# Patient Record
Sex: Female | Born: 1975 | Race: White | Hispanic: Yes | Marital: Married | Smoking: Never smoker
Health system: Southern US, Community
[De-identification: ages and names within clinical notes are randomized; demographics above are authoritative.]

## PROBLEM LIST (undated history)

## (undated) DIAGNOSIS — E7800 Pure hypercholesterolemia, unspecified: Secondary | ICD-10-CM

## (undated) DIAGNOSIS — N189 Chronic kidney disease, unspecified: Secondary | ICD-10-CM

## (undated) DIAGNOSIS — Z91018 Allergy to other foods: Secondary | ICD-10-CM

## (undated) DIAGNOSIS — G4730 Sleep apnea, unspecified: Secondary | ICD-10-CM

## (undated) DIAGNOSIS — M7989 Other specified soft tissue disorders: Secondary | ICD-10-CM

## (undated) DIAGNOSIS — I10 Essential (primary) hypertension: Secondary | ICD-10-CM

## (undated) DIAGNOSIS — K760 Fatty (change of) liver, not elsewhere classified: Secondary | ICD-10-CM

## (undated) DIAGNOSIS — E559 Vitamin D deficiency, unspecified: Secondary | ICD-10-CM

## (undated) DIAGNOSIS — C801 Malignant (primary) neoplasm, unspecified: Secondary | ICD-10-CM

## (undated) DIAGNOSIS — E119 Type 2 diabetes mellitus without complications: Secondary | ICD-10-CM

## (undated) DIAGNOSIS — R7303 Prediabetes: Secondary | ICD-10-CM

## (undated) DIAGNOSIS — K5900 Constipation, unspecified: Secondary | ICD-10-CM

## (undated) HISTORY — DX: Allergy to other foods: Z91.018

## (undated) HISTORY — DX: Pure hypercholesterolemia, unspecified: E78.00

## (undated) HISTORY — DX: Vitamin D deficiency, unspecified: E55.9

## (undated) HISTORY — DX: Fatty (change of) liver, not elsewhere classified: K76.0

## (undated) HISTORY — DX: Prediabetes: R73.03

## (undated) HISTORY — DX: Malignant (primary) neoplasm, unspecified: C80.1

## (undated) HISTORY — DX: Other specified soft tissue disorders: M79.89

## (undated) HISTORY — DX: Constipation, unspecified: K59.00

---

## 2013-12-29 ENCOUNTER — Other Ambulatory Visit: Payer: Self-pay | Admitting: Urology

## 2013-12-29 ENCOUNTER — Encounter (HOSPITAL_COMMUNITY): Payer: Self-pay | Admitting: Pharmacy Technician

## 2013-12-31 ENCOUNTER — Encounter (HOSPITAL_COMMUNITY): Payer: Self-pay | Admitting: *Deleted

## 2014-01-01 ENCOUNTER — Ambulatory Visit (HOSPITAL_COMMUNITY): Payer: Managed Care, Other (non HMO)

## 2014-01-01 ENCOUNTER — Ambulatory Visit (HOSPITAL_COMMUNITY)
Admission: RE | Admit: 2014-01-01 | Discharge: 2014-01-01 | Disposition: A | Discharge status: Home / Self Care | Payer: Managed Care, Other (non HMO) | Source: Ambulatory Visit | Attending: Urology | Admitting: Urology

## 2014-01-01 ENCOUNTER — Encounter (HOSPITAL_COMMUNITY): Payer: Self-pay | Admitting: *Deleted

## 2014-01-01 ENCOUNTER — Encounter (HOSPITAL_COMMUNITY)
Admission: RE | Disposition: A | Discharge status: Home / Self Care | Payer: Self-pay | Source: Ambulatory Visit | Attending: Urology

## 2014-01-01 DIAGNOSIS — I10 Essential (primary) hypertension: Secondary | ICD-10-CM | POA: Insufficient documentation

## 2014-01-01 DIAGNOSIS — G4730 Sleep apnea, unspecified: Secondary | ICD-10-CM | POA: Insufficient documentation

## 2014-01-01 DIAGNOSIS — N201 Calculus of ureter: Secondary | ICD-10-CM | POA: Insufficient documentation

## 2014-01-01 DIAGNOSIS — E7800 Pure hypercholesterolemia, unspecified: Secondary | ICD-10-CM | POA: Insufficient documentation

## 2014-01-01 DIAGNOSIS — N200 Calculus of kidney: Secondary | ICD-10-CM

## 2014-01-01 DIAGNOSIS — Z79899 Other long term (current) drug therapy: Secondary | ICD-10-CM | POA: Insufficient documentation

## 2014-01-01 DIAGNOSIS — N1330 Unspecified hydronephrosis: Secondary | ICD-10-CM | POA: Insufficient documentation

## 2014-01-01 HISTORY — DX: Chronic kidney disease, unspecified: N18.9

## 2014-01-01 HISTORY — DX: Essential (primary) hypertension: I10

## 2014-01-01 HISTORY — DX: Sleep apnea, unspecified: G47.30

## 2014-01-01 LAB — PREGNANCY, URINE: Preg Test, Ur: NEGATIVE

## 2014-01-01 SURGERY — LITHOTRIPSY, ESWL
Anesthesia: LOCAL | Laterality: Right

## 2014-01-01 MED ORDER — DIPHENHYDRAMINE HCL 25 MG PO CAPS
25.0000 mg | ORAL_CAPSULE | ORAL | Status: AC
  Administered 2014-01-01: 25 mg via ORAL
  Filled 2014-01-01: qty 1

## 2014-01-01 MED ORDER — TAMSULOSIN HCL 0.4 MG PO CAPS: 0.4000 mg | ORAL_CAPSULE | Freq: Every day | ORAL | Status: DC

## 2014-01-01 MED ORDER — ONDANSETRON HCL 4 MG PO TABS: 4.0000 mg | ORAL_TABLET | Freq: Three times a day (TID) | ORAL | Status: DC | PRN

## 2014-01-01 MED ORDER — SODIUM CHLORIDE 0.9 % IV SOLN
INTRAVENOUS | Status: DC
  Administered 2014-01-01: 07:00:00 via INTRAVENOUS

## 2014-01-01 MED ORDER — CIPROFLOXACIN HCL 500 MG PO TABS
500.0000 mg | ORAL_TABLET | ORAL | Status: AC
  Administered 2014-01-01: 500 mg via ORAL
  Filled 2014-01-01: qty 1

## 2014-01-01 MED ORDER — DIAZEPAM 5 MG PO TABS
10.0000 mg | ORAL_TABLET | ORAL | Status: AC
  Administered 2014-01-01: 10 mg via ORAL
  Filled 2014-01-01: qty 2

## 2014-01-01 NOTE — H&P (Signed)
Reason For Visit Bilateral nephrolithiasis   History of Present Illness This is a 38 year old female who is referred by Dr. Glendon Axe, M.D. for evaluation and management of nephrolithiasis.    As part of workup for abdominal pain. Patient had a renal ultrasound on 08/30/48. This showed bilateral nephrolithiasis. She then underwent a CT scan on 09/13/13, results are not available to me. Her report the patient has a large stone on the right. She also related that her ultrasound showed "fluid in her kidneys". The patient has a history of kidney stones. [place] she had an IVP approximately 20 years ago for an obstructing stone which she subsequently passed. She states she has passed many stones since then, the last stone was about 6 years ago. The patient has occasional twinges on her right side/flank. She denies any fevers or chills. She denies any gross hematuria. She does state that she has urinary frequency but denies any incontinence. She has no history of recurrent urinary tract infections.   Past Medical History Problems  1. History of hypercholesterolemia (V12.29) 2. History of hypertension (V12.59) 3. History of sleep apnea (V13.89)  Surgical History Problems  1. History of Cesarean Section  Current Meds 1. Advil TABS;  Therapy: (Recorded:07May2015) to Recorded 2. Phentermine HCl - 37.5 MG Oral Capsule;  Therapy: (Recorded:07May2015) to Recorded 3. Vitamin D TABS;  Therapy: (Recorded:07May2015) to Recorded  Allergies Medication  1. No Known Drug Allergies  Family History Problems  1. Family history of cardiac disorder (V17.49) : Mother, Father 2. Family history of diabetes mellitus (V18.0) : Mother, Father 3. Family history of hyperlipidemia (V18.19) : Father 4. Family history of hypertension (V17.49) : Father  Social History Problems    Denied: History of Alcohol use   Caffeine use (V49.89)   Married   Never a smoker   Number of  children   1 daughter  Review of Systems Genitourinary, constitutional, skin, eye, otolaryngeal, hematologic/lymphatic, cardiovascular, pulmonary, endocrine, musculoskeletal, gastrointestinal, neurological and psychiatric system(s) were reviewed and pertinent findings if present are noted.  Genitourinary: initiating urination requires straining.  Gastrointestinal: heartburn and constipation.  Constitutional: night sweats and recent weight loss.    Vitals Vital Signs [Data Includes: Last 1 Day]  Recorded: [id number] 08:57AM  Blood Pressure: 148 / 88 Temperature: 98.1 F Heart Rate: 85 Recorded: [id number] 08:49AM  Height: 5 ft 2 in Weight: 196 lb  BMI Calculated: 35.85 BSA Calculated: 1.9  Results/Data Urine [Data Includes: Last 1 Day]   [id number]  COLOR YELLOW   APPEARANCE CLEAR   SPECIFIC GRAVITY 1.025   pH 5.5   GLUCOSE NEG mg/dL  BILIRUBIN NEG   KETONE NEG mg/dL  BLOOD TRACE   PROTEIN NEG mg/dL  UROBILINOGEN 0.2 mg/dL  NITRITE NEG   LEUKOCYTE ESTERASE SMALL   SQUAMOUS EPITHELIAL/HPF FEW   WBC 7-10 WBC/hpf  RBC 0-2 RBC/hpf  BACTERIA RARE   CRYSTALS NONE SEEN   CASTS NONE SEEN   Other MUCUS   Exam description: renal ultrasound. Kidneys: left kidney 13.08 cm in longitudinal dimension. Stone #1 characteristics: left middle calyx, 0.53 cm mm. Hydronephrosis: right moderate hydronephrosis. Ureteral dilatation: right moderate ureteral dilation. Hydroureteronephrosis findings: no left hydroureteronephrosis.  Exam description: kidney, ureter and bladder x-rays of the abdomen. Bowels: normal bowel gas pattern. Calcification findings: left renal area. 5 mm Solid organs: solid organs visualized are normal in size, appearance and position.    Assessment Assessed  1. Nephrolithiasis (592.0)  Based on the information that I have the  patient has a left 5 mm interpolar nonobstructing stone and right-sided mild to moderate hydro-nephrosis and hydro-ureter. There is no evidence of any  stones within the right urinary tract based on the imaging today.   Plan Health Maintenance  1. UA With REFLEX; [Do Not Release]; Status:Complete;   Done: [id number] 08:38AM Nephrolithiasis  2. Follow-up Office  Follow-up, we'll contact patient once I get the results of her CAT scan to  discuss follow-up and treatment options for her right hydronephrosis.  Status: Hold For -  Appointment,Date of Service  Requested for: [id number] 3. KUB; Status:Resulted - Requires Verification;   Done: [id number] 12:00AM 4. RENAL U/S COMPLETE; Status:Resulted - Requires Verification;   Done: [id number]  12:00AM 5. URINE CULTURE; Status:In Progress - Specimen/Data Collected;   Done: [id number]  Discussion/Summary I discussed the findings with the patient in detail. We will obtain her CT images that were taken at [facility] 3 months prior. Once I had a chance to evaluate these I will call the patient with the findings and recommend a plan of action. We did briefly discuss cystoscopy and retrograde pyelogram on the right to evaluate her hydronephrosis if we do not find an obstructing stone. Alternatively we could perform a MAG3 renogram. We'll discuss this further once I had a chance to see her CT scan. For the nonobstructing stone in the patient's left side, I recommended that we monitor this for now.   Addendum: CT scan showed large distal ureteral stone on the right.  I discussed treatment options with the patient on the phone and she would like to try ESWL.  We discussed risk/benefits of this procedure and she is ready to proceed.    No changes to the above.

## 2014-01-01 NOTE — Discharge Instructions (Signed)
See [facility] discharge instructions in chart.  

## 2014-01-01 NOTE — Op Note (Signed)
See [facility] OP note scanned into chart. 

## 2014-08-14 HISTORY — PX: MASTECTOMY: SHX3

## 2016-08-14 HISTORY — PX: ABDOMINAL HYSTERECTOMY: SHX81

## 2019-08-22 ENCOUNTER — Other Ambulatory Visit: Payer: Self-pay | Admitting: Urology

## 2019-08-22 DIAGNOSIS — N200 Calculus of kidney: Secondary | ICD-10-CM

## 2019-08-23 ENCOUNTER — Other Ambulatory Visit (HOSPITAL_COMMUNITY)
Admission: RE | Admit: 2019-08-23 | Discharge: 2019-08-23 | Disposition: A | Discharge status: Home / Self Care | Payer: BLUE CROSS/BLUE SHIELD | Source: Ambulatory Visit | Attending: Urology | Admitting: Urology

## 2019-08-23 DIAGNOSIS — Z20822 Contact with and (suspected) exposure to covid-19: Secondary | ICD-10-CM | POA: Diagnosis not present

## 2019-08-23 DIAGNOSIS — Z01812 Encounter for preprocedural laboratory examination: Secondary | ICD-10-CM | POA: Insufficient documentation

## 2019-08-23 LAB — SARS CORONAVIRUS 2 (TAT 6-24 HRS): SARS Coronavirus 2: NEGATIVE

## 2019-08-25 ENCOUNTER — Ambulatory Visit (HOSPITAL_BASED_OUTPATIENT_CLINIC_OR_DEPARTMENT_OTHER): Payer: BLUE CROSS/BLUE SHIELD | Admitting: Anesthesiology

## 2019-08-25 ENCOUNTER — Ambulatory Visit (HOSPITAL_COMMUNITY): Payer: BLUE CROSS/BLUE SHIELD

## 2019-08-25 ENCOUNTER — Encounter (HOSPITAL_BASED_OUTPATIENT_CLINIC_OR_DEPARTMENT_OTHER)
Admission: RE | Disposition: A | Discharge status: Home / Self Care | Payer: Self-pay | Source: Home / Self Care | Attending: Urology

## 2019-08-25 ENCOUNTER — Encounter (HOSPITAL_BASED_OUTPATIENT_CLINIC_OR_DEPARTMENT_OTHER): Payer: Self-pay | Admitting: Urology

## 2019-08-25 ENCOUNTER — Ambulatory Visit (HOSPITAL_BASED_OUTPATIENT_CLINIC_OR_DEPARTMENT_OTHER)
Admission: RE | Admit: 2019-08-25 | Discharge: 2019-08-25 | Disposition: A | Discharge status: Home / Self Care | Payer: BLUE CROSS/BLUE SHIELD | Attending: Urology | Admitting: Urology

## 2019-08-25 DIAGNOSIS — E119 Type 2 diabetes mellitus without complications: Secondary | ICD-10-CM | POA: Insufficient documentation

## 2019-08-25 DIAGNOSIS — N200 Calculus of kidney: Secondary | ICD-10-CM

## 2019-08-25 DIAGNOSIS — G4730 Sleep apnea, unspecified: Secondary | ICD-10-CM | POA: Insufficient documentation

## 2019-08-25 DIAGNOSIS — E669 Obesity, unspecified: Secondary | ICD-10-CM | POA: Diagnosis not present

## 2019-08-25 DIAGNOSIS — I10 Essential (primary) hypertension: Secondary | ICD-10-CM | POA: Insufficient documentation

## 2019-08-25 DIAGNOSIS — Z6834 Body mass index (BMI) 34.0-34.9, adult: Secondary | ICD-10-CM | POA: Insufficient documentation

## 2019-08-25 DIAGNOSIS — N201 Calculus of ureter: Secondary | ICD-10-CM

## 2019-08-25 HISTORY — PX: EXTRACORPOREAL SHOCK WAVE LITHOTRIPSY: SHX1557

## 2019-08-25 HISTORY — DX: Type 2 diabetes mellitus without complications: E11.9

## 2019-08-25 LAB — GLUCOSE, CAPILLARY: Glucose-Capillary: 144 mg/dL — ABNORMAL HIGH (ref 70–99)

## 2019-08-25 SURGERY — LITHOTRIPSY, ESWL
Anesthesia: LOCAL | Laterality: Left

## 2019-08-25 MED ORDER — CIPROFLOXACIN IN D5W 400 MG/200ML IV SOLN
400.0000 mg | INTRAVENOUS | Status: DC
  Filled 2019-08-25: qty 200

## 2019-08-25 MED ORDER — DIAZEPAM 5 MG PO TABS
10.0000 mg | ORAL_TABLET | ORAL | Status: AC
  Administered 2019-08-25: 07:00:00 10 mg via ORAL
  Filled 2019-08-25: qty 2

## 2019-08-25 MED ORDER — FENTANYL CITRATE (PF) 100 MCG/2ML IJ SOLN
INTRAMUSCULAR | Status: AC
  Filled 2019-08-25: qty 2

## 2019-08-25 MED ORDER — PROPOFOL 10 MG/ML IV BOLUS
INTRAVENOUS | Status: AC
  Filled 2019-08-25: qty 40

## 2019-08-25 MED ORDER — DIPHENHYDRAMINE HCL 25 MG PO CAPS
ORAL_CAPSULE | ORAL | Status: AC
  Filled 2019-08-25: qty 1

## 2019-08-25 MED ORDER — CIPROFLOXACIN HCL 500 MG PO TABS
500.0000 mg | ORAL_TABLET | Freq: Once | ORAL | Status: AC
  Administered 2019-08-25: 07:00:00 500 mg via ORAL
  Filled 2019-08-25: qty 1

## 2019-08-25 MED ORDER — DEXAMETHASONE SODIUM PHOSPHATE 10 MG/ML IJ SOLN
INTRAMUSCULAR | Status: AC
  Filled 2019-08-25: qty 1

## 2019-08-25 MED ORDER — MIDAZOLAM HCL 2 MG/2ML IJ SOLN
INTRAMUSCULAR | Status: AC
  Filled 2019-08-25: qty 2

## 2019-08-25 MED ORDER — SODIUM CHLORIDE 0.9 % IV SOLN
INTRAVENOUS | Status: DC
  Filled 2019-08-25: qty 1000

## 2019-08-25 MED ORDER — KETOROLAC TROMETHAMINE 30 MG/ML IJ SOLN
INTRAMUSCULAR | Status: AC
  Filled 2019-08-25: qty 1

## 2019-08-25 MED ORDER — CIPROFLOXACIN HCL 500 MG PO TABS
ORAL_TABLET | ORAL | Status: AC
  Filled 2019-08-25: qty 1

## 2019-08-25 MED ORDER — DIAZEPAM 5 MG PO TABS
ORAL_TABLET | ORAL | Status: AC
  Filled 2019-08-25: qty 2

## 2019-08-25 MED ORDER — DIPHENHYDRAMINE HCL 25 MG PO CAPS
25.0000 mg | ORAL_CAPSULE | ORAL | Status: AC
  Administered 2019-08-25: 25 mg via ORAL
  Filled 2019-08-25: qty 1

## 2019-08-25 MED ORDER — LIDOCAINE 2% (20 MG/ML) 5 ML SYRINGE
INTRAMUSCULAR | Status: AC
  Filled 2019-08-25: qty 5

## 2019-08-25 MED ORDER — MIDAZOLAM HCL 5 MG/5ML IJ SOLN: INTRAMUSCULAR | Status: DC | PRN

## 2019-08-25 NOTE — H&P (Signed)
See scanned H&P

## 2019-08-25 NOTE — Op Note (Signed)
See [facility] OP note scanned into chart. Also because of the size, density, location and other factors that cannot be anticipated I feel this will likely be a staged procedure. This fact supersedes any indication in the scanned [facility] operative note to the contrary.  

## 2019-08-25 NOTE — Discharge Instructions (Signed)

## 2019-12-07 DIAGNOSIS — Z79899 Other long term (current) drug therapy: Secondary | ICD-10-CM | POA: Diagnosis not present

## 2019-12-07 DIAGNOSIS — D539 Nutritional anemia, unspecified: Secondary | ICD-10-CM | POA: Diagnosis not present

## 2019-12-07 DIAGNOSIS — I10 Essential (primary) hypertension: Secondary | ICD-10-CM | POA: Diagnosis not present

## 2019-12-07 DIAGNOSIS — E782 Mixed hyperlipidemia: Secondary | ICD-10-CM | POA: Diagnosis not present

## 2019-12-07 DIAGNOSIS — E1140 Type 2 diabetes mellitus with diabetic neuropathy, unspecified: Secondary | ICD-10-CM | POA: Diagnosis not present

## 2019-12-07 DIAGNOSIS — N1831 Chronic kidney disease, stage 3a: Secondary | ICD-10-CM | POA: Diagnosis not present

## 2019-12-07 DIAGNOSIS — E559 Vitamin D deficiency, unspecified: Secondary | ICD-10-CM | POA: Diagnosis not present

## 2019-12-07 DIAGNOSIS — Z1159 Encounter for screening for other viral diseases: Secondary | ICD-10-CM | POA: Diagnosis not present

## 2019-12-24 DIAGNOSIS — K760 Fatty (change of) liver, not elsewhere classified: Secondary | ICD-10-CM | POA: Diagnosis not present

## 2019-12-30 DIAGNOSIS — R160 Hepatomegaly, not elsewhere classified: Secondary | ICD-10-CM | POA: Diagnosis not present

## 2019-12-30 DIAGNOSIS — E1140 Type 2 diabetes mellitus with diabetic neuropathy, unspecified: Secondary | ICD-10-CM | POA: Diagnosis not present

## 2019-12-30 DIAGNOSIS — J301 Allergic rhinitis due to pollen: Secondary | ICD-10-CM | POA: Diagnosis not present

## 2019-12-30 DIAGNOSIS — E782 Mixed hyperlipidemia: Secondary | ICD-10-CM | POA: Diagnosis not present

## 2020-01-01 DIAGNOSIS — R160 Hepatomegaly, not elsewhere classified: Secondary | ICD-10-CM | POA: Diagnosis not present

## 2020-01-27 DIAGNOSIS — Z9989 Dependence on other enabling machines and devices: Secondary | ICD-10-CM | POA: Diagnosis not present

## 2020-01-27 DIAGNOSIS — G4733 Obstructive sleep apnea (adult) (pediatric): Secondary | ICD-10-CM | POA: Diagnosis not present

## 2020-01-27 DIAGNOSIS — I10 Essential (primary) hypertension: Secondary | ICD-10-CM | POA: Diagnosis not present

## 2020-02-09 DIAGNOSIS — G4733 Obstructive sleep apnea (adult) (pediatric): Secondary | ICD-10-CM | POA: Diagnosis not present

## 2020-03-08 DIAGNOSIS — N200 Calculus of kidney: Secondary | ICD-10-CM | POA: Diagnosis not present

## 2020-03-24 DIAGNOSIS — R748 Abnormal levels of other serum enzymes: Secondary | ICD-10-CM | POA: Diagnosis not present

## 2020-03-24 DIAGNOSIS — K760 Fatty (change of) liver, not elsewhere classified: Secondary | ICD-10-CM | POA: Diagnosis not present

## 2020-03-24 DIAGNOSIS — Z1159 Encounter for screening for other viral diseases: Secondary | ICD-10-CM | POA: Diagnosis not present

## 2020-03-25 DIAGNOSIS — K760 Fatty (change of) liver, not elsewhere classified: Secondary | ICD-10-CM | POA: Diagnosis not present

## 2020-03-28 DIAGNOSIS — Z20822 Contact with and (suspected) exposure to covid-19: Secondary | ICD-10-CM | POA: Diagnosis not present

## 2020-03-28 DIAGNOSIS — D539 Nutritional anemia, unspecified: Secondary | ICD-10-CM | POA: Diagnosis not present

## 2020-03-28 DIAGNOSIS — E559 Vitamin D deficiency, unspecified: Secondary | ICD-10-CM | POA: Diagnosis not present

## 2020-03-28 DIAGNOSIS — N1831 Chronic kidney disease, stage 3a: Secondary | ICD-10-CM | POA: Diagnosis not present

## 2020-03-28 DIAGNOSIS — K760 Fatty (change of) liver, not elsewhere classified: Secondary | ICD-10-CM | POA: Diagnosis not present

## 2020-03-28 DIAGNOSIS — Z79899 Other long term (current) drug therapy: Secondary | ICD-10-CM | POA: Diagnosis not present

## 2020-03-28 DIAGNOSIS — E1140 Type 2 diabetes mellitus with diabetic neuropathy, unspecified: Secondary | ICD-10-CM | POA: Diagnosis not present

## 2020-03-28 DIAGNOSIS — E782 Mixed hyperlipidemia: Secondary | ICD-10-CM | POA: Diagnosis not present

## 2020-03-28 DIAGNOSIS — K9041 Non-celiac gluten sensitivity: Secondary | ICD-10-CM | POA: Diagnosis not present

## 2020-03-31 DIAGNOSIS — Z9882 Breast implant status: Secondary | ICD-10-CM | POA: Diagnosis not present

## 2020-03-31 DIAGNOSIS — Z9013 Acquired absence of bilateral breasts and nipples: Secondary | ICD-10-CM | POA: Diagnosis not present

## 2020-03-31 DIAGNOSIS — Z08 Encounter for follow-up examination after completed treatment for malignant neoplasm: Secondary | ICD-10-CM | POA: Diagnosis not present

## 2020-03-31 DIAGNOSIS — Z853 Personal history of malignant neoplasm of breast: Secondary | ICD-10-CM | POA: Diagnosis not present

## 2020-05-11 DIAGNOSIS — G4733 Obstructive sleep apnea (adult) (pediatric): Secondary | ICD-10-CM | POA: Diagnosis not present

## 2020-05-12 DIAGNOSIS — Z853 Personal history of malignant neoplasm of breast: Secondary | ICD-10-CM | POA: Diagnosis not present

## 2020-05-12 DIAGNOSIS — Z9013 Acquired absence of bilateral breasts and nipples: Secondary | ICD-10-CM | POA: Diagnosis not present

## 2020-06-24 DIAGNOSIS — Z1211 Encounter for screening for malignant neoplasm of colon: Secondary | ICD-10-CM | POA: Diagnosis not present

## 2020-06-24 DIAGNOSIS — Z853 Personal history of malignant neoplasm of breast: Secondary | ICD-10-CM | POA: Diagnosis not present

## 2020-06-24 DIAGNOSIS — Z1589 Genetic susceptibility to other disease: Secondary | ICD-10-CM | POA: Diagnosis not present

## 2020-07-14 DIAGNOSIS — Z1211 Encounter for screening for malignant neoplasm of colon: Secondary | ICD-10-CM | POA: Diagnosis not present

## 2020-07-14 DIAGNOSIS — K5730 Diverticulosis of large intestine without perforation or abscess without bleeding: Secondary | ICD-10-CM | POA: Diagnosis not present

## 2020-07-14 DIAGNOSIS — Z1589 Genetic susceptibility to other disease: Secondary | ICD-10-CM | POA: Diagnosis not present

## 2020-07-14 DIAGNOSIS — K644 Residual hemorrhoidal skin tags: Secondary | ICD-10-CM | POA: Diagnosis not present

## 2020-07-14 DIAGNOSIS — K648 Other hemorrhoids: Secondary | ICD-10-CM | POA: Diagnosis not present

## 2020-07-18 DIAGNOSIS — N1831 Chronic kidney disease, stage 3a: Secondary | ICD-10-CM | POA: Diagnosis not present

## 2020-07-18 DIAGNOSIS — E782 Mixed hyperlipidemia: Secondary | ICD-10-CM | POA: Diagnosis not present

## 2020-07-18 DIAGNOSIS — R0602 Shortness of breath: Secondary | ICD-10-CM | POA: Diagnosis not present

## 2020-07-18 DIAGNOSIS — Z23 Encounter for immunization: Secondary | ICD-10-CM | POA: Diagnosis not present

## 2020-07-18 DIAGNOSIS — Z0000 Encounter for general adult medical examination without abnormal findings: Secondary | ICD-10-CM | POA: Diagnosis not present

## 2020-07-18 DIAGNOSIS — D539 Nutritional anemia, unspecified: Secondary | ICD-10-CM | POA: Diagnosis not present

## 2020-07-18 DIAGNOSIS — E1140 Type 2 diabetes mellitus with diabetic neuropathy, unspecified: Secondary | ICD-10-CM | POA: Diagnosis not present

## 2020-07-18 DIAGNOSIS — Z6834 Body mass index (BMI) 34.0-34.9, adult: Secondary | ICD-10-CM | POA: Diagnosis not present

## 2020-07-18 DIAGNOSIS — I10 Essential (primary) hypertension: Secondary | ICD-10-CM | POA: Diagnosis not present

## 2020-07-18 DIAGNOSIS — Z20822 Contact with and (suspected) exposure to covid-19: Secondary | ICD-10-CM | POA: Diagnosis not present

## 2020-07-18 DIAGNOSIS — E559 Vitamin D deficiency, unspecified: Secondary | ICD-10-CM | POA: Diagnosis not present

## 2020-07-18 DIAGNOSIS — Z79899 Other long term (current) drug therapy: Secondary | ICD-10-CM | POA: Diagnosis not present

## 2020-07-18 DIAGNOSIS — Z13220 Encounter for screening for lipoid disorders: Secondary | ICD-10-CM | POA: Diagnosis not present

## 2020-08-09 DIAGNOSIS — G4733 Obstructive sleep apnea (adult) (pediatric): Secondary | ICD-10-CM | POA: Diagnosis not present

## 2020-08-09 DIAGNOSIS — U071 COVID-19: Secondary | ICD-10-CM | POA: Diagnosis not present

## 2020-08-15 DIAGNOSIS — I10 Essential (primary) hypertension: Secondary | ICD-10-CM | POA: Diagnosis not present

## 2020-08-15 DIAGNOSIS — K760 Fatty (change of) liver, not elsewhere classified: Secondary | ICD-10-CM | POA: Diagnosis not present

## 2020-08-15 DIAGNOSIS — E782 Mixed hyperlipidemia: Secondary | ICD-10-CM | POA: Diagnosis not present

## 2020-08-15 DIAGNOSIS — E1140 Type 2 diabetes mellitus with diabetic neuropathy, unspecified: Secondary | ICD-10-CM | POA: Diagnosis not present

## 2020-11-10 DIAGNOSIS — G4733 Obstructive sleep apnea (adult) (pediatric): Secondary | ICD-10-CM | POA: Diagnosis not present

## 2020-12-05 DIAGNOSIS — R0981 Nasal congestion: Secondary | ICD-10-CM | POA: Diagnosis not present

## 2020-12-05 DIAGNOSIS — R059 Cough, unspecified: Secondary | ICD-10-CM | POA: Diagnosis not present

## 2020-12-26 DIAGNOSIS — E559 Vitamin D deficiency, unspecified: Secondary | ICD-10-CM | POA: Diagnosis not present

## 2020-12-26 DIAGNOSIS — E782 Mixed hyperlipidemia: Secondary | ICD-10-CM | POA: Diagnosis not present

## 2020-12-26 DIAGNOSIS — Z79899 Other long term (current) drug therapy: Secondary | ICD-10-CM | POA: Diagnosis not present

## 2020-12-26 DIAGNOSIS — Z6834 Body mass index (BMI) 34.0-34.9, adult: Secondary | ICD-10-CM | POA: Diagnosis not present

## 2020-12-26 DIAGNOSIS — I10 Essential (primary) hypertension: Secondary | ICD-10-CM | POA: Diagnosis not present

## 2020-12-26 DIAGNOSIS — Z853 Personal history of malignant neoplasm of breast: Secondary | ICD-10-CM | POA: Diagnosis not present

## 2020-12-26 DIAGNOSIS — E1140 Type 2 diabetes mellitus with diabetic neuropathy, unspecified: Secondary | ICD-10-CM | POA: Diagnosis not present

## 2020-12-26 DIAGNOSIS — Z1159 Encounter for screening for other viral diseases: Secondary | ICD-10-CM | POA: Diagnosis not present

## 2021-01-03 DIAGNOSIS — E782 Mixed hyperlipidemia: Secondary | ICD-10-CM | POA: Diagnosis not present

## 2021-01-03 DIAGNOSIS — R0989 Other specified symptoms and signs involving the circulatory and respiratory systems: Secondary | ICD-10-CM | POA: Diagnosis not present

## 2021-01-03 DIAGNOSIS — E1140 Type 2 diabetes mellitus with diabetic neuropathy, unspecified: Secondary | ICD-10-CM | POA: Diagnosis not present

## 2021-01-03 DIAGNOSIS — J301 Allergic rhinitis due to pollen: Secondary | ICD-10-CM | POA: Diagnosis not present

## 2021-01-03 DIAGNOSIS — Z6834 Body mass index (BMI) 34.0-34.9, adult: Secondary | ICD-10-CM | POA: Diagnosis not present

## 2021-01-03 DIAGNOSIS — J029 Acute pharyngitis, unspecified: Secondary | ICD-10-CM | POA: Diagnosis not present

## 2021-01-18 DIAGNOSIS — R059 Cough, unspecified: Secondary | ICD-10-CM | POA: Diagnosis not present

## 2021-01-18 DIAGNOSIS — J029 Acute pharyngitis, unspecified: Secondary | ICD-10-CM | POA: Diagnosis not present

## 2021-01-18 DIAGNOSIS — J018 Other acute sinusitis: Secondary | ICD-10-CM | POA: Diagnosis not present

## 2021-01-18 DIAGNOSIS — J301 Allergic rhinitis due to pollen: Secondary | ICD-10-CM | POA: Diagnosis not present

## 2021-01-18 DIAGNOSIS — E1140 Type 2 diabetes mellitus with diabetic neuropathy, unspecified: Secondary | ICD-10-CM | POA: Diagnosis not present

## 2021-01-26 DIAGNOSIS — K760 Fatty (change of) liver, not elsewhere classified: Secondary | ICD-10-CM | POA: Diagnosis not present

## 2021-02-08 DIAGNOSIS — G4733 Obstructive sleep apnea (adult) (pediatric): Secondary | ICD-10-CM | POA: Diagnosis not present

## 2021-02-28 DIAGNOSIS — N200 Calculus of kidney: Secondary | ICD-10-CM | POA: Diagnosis not present

## 2021-03-29 ENCOUNTER — Encounter (INDEPENDENT_AMBULATORY_CARE_PROVIDER_SITE_OTHER): Payer: Self-pay | Admitting: Family Medicine

## 2021-03-29 ENCOUNTER — Other Ambulatory Visit: Payer: Self-pay

## 2021-03-29 ENCOUNTER — Ambulatory Visit (INDEPENDENT_AMBULATORY_CARE_PROVIDER_SITE_OTHER): Payer: BLUE CROSS/BLUE SHIELD | Admitting: Family Medicine

## 2021-03-29 VITALS — BP 137/87 | HR 72 | Temp 98.1°F | Ht 62.0 in | Wt 190.0 lb

## 2021-03-29 DIAGNOSIS — Z6834 Body mass index (BMI) 34.0-34.9, adult: Secondary | ICD-10-CM

## 2021-03-29 DIAGNOSIS — I152 Hypertension secondary to endocrine disorders: Secondary | ICD-10-CM | POA: Diagnosis not present

## 2021-03-29 DIAGNOSIS — E1159 Type 2 diabetes mellitus with other circulatory complications: Secondary | ICD-10-CM

## 2021-03-29 DIAGNOSIS — K760 Fatty (change of) liver, not elsewhere classified: Secondary | ICD-10-CM

## 2021-03-29 DIAGNOSIS — R0602 Shortness of breath: Secondary | ICD-10-CM | POA: Diagnosis not present

## 2021-03-29 DIAGNOSIS — E669 Obesity, unspecified: Secondary | ICD-10-CM

## 2021-03-29 DIAGNOSIS — Z1331 Encounter for screening for depression: Secondary | ICD-10-CM

## 2021-03-29 DIAGNOSIS — Z0289 Encounter for other administrative examinations: Secondary | ICD-10-CM

## 2021-03-29 DIAGNOSIS — E559 Vitamin D deficiency, unspecified: Secondary | ICD-10-CM | POA: Diagnosis not present

## 2021-03-29 DIAGNOSIS — E1169 Type 2 diabetes mellitus with other specified complication: Secondary | ICD-10-CM

## 2021-03-29 DIAGNOSIS — R5383 Other fatigue: Secondary | ICD-10-CM

## 2021-03-29 DIAGNOSIS — E1165 Type 2 diabetes mellitus with hyperglycemia: Secondary | ICD-10-CM | POA: Diagnosis not present

## 2021-03-29 DIAGNOSIS — E785 Hyperlipidemia, unspecified: Secondary | ICD-10-CM

## 2021-03-29 DIAGNOSIS — G4733 Obstructive sleep apnea (adult) (pediatric): Secondary | ICD-10-CM

## 2021-03-29 DIAGNOSIS — Z853 Personal history of malignant neoplasm of breast: Secondary | ICD-10-CM

## 2021-03-30 LAB — COMPREHENSIVE METABOLIC PANEL
ALT: 38 IU/L — ABNORMAL HIGH (ref 0–32)
AST: 29 IU/L (ref 0–40)
Albumin/Globulin Ratio: 2.1 (ref 1.2–2.2)
Albumin: 4.9 g/dL — ABNORMAL HIGH (ref 3.8–4.8)
Alkaline Phosphatase: 84 IU/L (ref 44–121)
BUN/Creatinine Ratio: 14 (ref 9–23)
BUN: 9 mg/dL (ref 6–24)
Bilirubin Total: 0.3 mg/dL (ref 0.0–1.2)
CO2: 21 mmol/L (ref 20–29)
Calcium: 9.8 mg/dL (ref 8.7–10.2)
Chloride: 101 mmol/L (ref 96–106)
Creatinine, Ser: 0.63 mg/dL (ref 0.57–1.00)
Globulin, Total: 2.3 g/dL (ref 1.5–4.5)
Glucose: 169 mg/dL — ABNORMAL HIGH (ref 65–99)
Potassium: 4.6 mmol/L (ref 3.5–5.2)
Sodium: 137 mmol/L (ref 134–144)
Total Protein: 7.2 g/dL (ref 6.0–8.5)
eGFR: 114 mL/min/{1.73_m2} (ref >59)

## 2021-03-30 LAB — CBC WITH DIFFERENTIAL/PLATELET
Basophils Absolute: 0.1 10*3/uL (ref 0.0–0.2)
Basos: 1 %
EOS (ABSOLUTE): 0.5 10*3/uL — ABNORMAL HIGH (ref 0.0–0.4)
Eos: 7 %
Hematocrit: 38 % (ref 34.0–46.6)
Hemoglobin: 12.6 g/dL (ref 11.1–15.9)
Immature Grans (Abs): 0 10*3/uL (ref 0.0–0.1)
Immature Granulocytes: 1 %
Lymphocytes Absolute: 2.5 10*3/uL (ref 0.7–3.1)
Lymphs: 31 %
MCH: 29.7 pg (ref 26.6–33.0)
MCHC: 33.2 g/dL (ref 31.5–35.7)
MCV: 90 fL (ref 79–97)
Monocytes Absolute: 0.4 10*3/uL (ref 0.1–0.9)
Monocytes: 5 %
Neutrophils Absolute: 4.5 10*3/uL (ref 1.4–7.0)
Neutrophils: 55 %
Platelets: 272 10*3/uL (ref 150–450)
RBC: 4.24 x10E6/uL (ref 3.77–5.28)
RDW: 12.7 % (ref 11.7–15.4)
WBC: 8 10*3/uL (ref 3.4–10.8)

## 2021-03-30 LAB — TSH: TSH: 2.22 u[IU]/mL (ref 0.450–4.500)

## 2021-03-30 LAB — T3: T3, Total: 116 ng/dL (ref 71–180)

## 2021-03-30 LAB — VITAMIN B12: Vitamin B-12: 288 pg/mL (ref 232–1245)

## 2021-03-30 LAB — LIPID PANEL WITH LDL/HDL RATIO
Cholesterol, Total: 163 mg/dL (ref 100–199)
HDL: 49 mg/dL (ref >39)
LDL Chol Calc (NIH): 87 mg/dL (ref 0–99)
LDL/HDL Ratio: 1.8 ratio (ref 0.0–3.2)
Triglycerides: 154 mg/dL — ABNORMAL HIGH (ref 0–149)
VLDL Cholesterol Cal: 27 mg/dL (ref 5–40)

## 2021-03-30 LAB — T4, FREE: Free T4: 0.98 ng/dL (ref 0.82–1.77)

## 2021-03-30 LAB — VITAMIN D 25 HYDROXY (VIT D DEFICIENCY, FRACTURES): Vit D, 25-Hydroxy: 38.4 ng/mL (ref 30.0–100.0)

## 2021-03-30 LAB — INSULIN, RANDOM: INSULIN: 53.3 u[IU]/mL — ABNORMAL HIGH (ref 2.6–24.9)

## 2021-03-30 LAB — HEMOGLOBIN A1C
Est. average glucose Bld gHb Est-mCnc: 148 mg/dL
Hgb A1c MFr Bld: 6.8 % — ABNORMAL HIGH (ref 4.8–5.6)

## 2021-03-30 LAB — FOLATE: Folate: 6.5 ng/mL (ref >3.0)

## 2021-03-30 NOTE — Progress Notes (Signed)
Chief Complaint:   OBESITY Sheila Guerra (MR# [id number]) is a 45 y.o. female who presents for evaluation and treatment of obesity and related comorbidities. Current BMI is Body mass index is 34.75 kg/m. Sheila Guerra has been struggling with her weight for many years and has been unsuccessful in either losing weight, maintaining weight loss, or reaching her healthy weight goal.  Sheila Guerra is currently in the action stage of change and ready to dedicate time achieving and maintaining a healthier weight. Sheila Guerra is interested in becoming our patient and working on intensive lifestyle modifications including (but not limited to) diet and exercise for weight loss.  Referred by coworker. Sheila Guerra is at [place] from 8-4:30 PM M-F for B of A. 2 pregnancies- lost pregnancy weight. She is trying to be vegetarian. Coffee in the morning with Splenda and creamer powder with 2 scrambled eggs + slice of bread or potato. Nuts as snack during the day (1/4 cup) (habit); Lunch- rice, veggies + chicken or pasta or sandwich- 2 slices [place] and cheese; Afternoon snack- apple or pear; Dinner- beans (1 cup) with rice + milkshake with fruit or small amount of nuts.   Sheila Guerra habits were reviewed today and are as follows: Her family eats meals together, she thinks her family will eat healthier with her, she struggles with family and or coworkers weight loss sabotage, her desired weight loss is 45 lbs, she started gaining weight after pregnancy, her heaviest weight ever was 198 pounds, she has significant food cravings issues, she snacks frequently in the evenings, she is trying to follow a vegetarian diet, she is trying to follow a vegan diet, she is frequently drinking liquids with calories, she frequently eats larger portions than normal, she has binge eating behaviors, and she struggles with emotional eating.  Depression Screen Sheila Guerra Food and Mood (modified PHQ-9) score was 4.  Depression screen Sheila Guerra 2/9 03/29/2021   Decreased Interest 0  Down, Depressed, Hopeless 1  PHQ - 2 Score 1  Altered sleeping 0  Tired, decreased energy 1  Change in appetite 1  Feeling bad or failure about yourself  1  Trouble concentrating 0  Moving slowly or fidgety/restless 0  Suicidal thoughts 0  PHQ-9 Score 4  Difficult doing work/chores Not difficult at all   Subjective:   1. Other fatigue Sheila Guerra admits to daytime somnolence and denies waking up still tired. Patent has a history of symptoms of daytime fatigue. Sheila Guerra generally gets 7 or 8 hours of sleep per night, and states that she has generally restful sleep. Snoring is present. Apneic episodes are not present. Epworth Sleepiness Score is 7. EKG normal sinus rhythm at 72 bpm.  2. SOB (shortness of breath) [name] notes increasing shortness of breath with exercising and seems to be worsening over time with weight gain. She notes getting out of breath sooner with activity than she used to. This has not gotten worse recently. [name] denies shortness of breath at rest or orthopnea. EKG normal sinus rhythm at 72 bpm.  3. Type 2 diabetes mellitus with hyperglycemia, without long-term current use of insulin (Sheila Guerra) Diagnosed 2018. Sheila Guerra last A1c was 6.2. She is on Metformin.  4. Hypertension associated with diabetes (Sheila Guerra) Diagnosed 2010. She is on labetalol (previously on lisinopril before 2014). BP well controlled today. Pt denies chest pain/chest pressure/headache.  5. Hyperlipidemia associated with type 2 diabetes mellitus (Sheila Guerra) Unsure when diagnosed. Sheila Guerra is on Crestor and denies myalgias.  6. Obstructive sleep apnea Diagnosed 2010. Pt has CPAP  with almost 100% compliance.  7. NAFLD (nonalcoholic fatty liver disease) Diagnosed with blood work and ultrasound.  8. Vitamin D deficiency Sheila Guerra is taking prescription Vit D weekly.  9. History of breast cancer Status post total mastectomy and reconstruction.  Assessment/Plan:   1. Other fatigue [name] does not feel  that her weight is causing her energy to be lower than it should be. Fatigue may be related to obesity, depression or many other causes. Labs will be ordered, and in the meanwhile, [name] will focus on self care including making healthy food choices, increasing physical activity and focusing on stress reduction. Check labs today.  - EKG 12-Lead - Vitamin B12 - Folate - T3 - T4, free - TSH  2. SOB (shortness of breath) Sheila Guerra does not feel that she gets out of breath more easily that she used to when she exercises. [name]'s shortness of breath appears to be obesity related and exercise induced. She has agreed to work on weight loss and gradually increase exercise to treat her exercise induced shortness of breath. Will continue to monitor closely. Check labs today.  - CBC with Differential/Platelet  3. Type 2 diabetes mellitus with hyperglycemia, without long-term current use of insulin (Sheila Guerra) Good blood sugar control is important to decrease the likelihood of diabetic complications such as nephropathy, neuropathy, limb loss, blindness, coronary artery disease, and death. Intensive lifestyle modification including diet, exercise and weight loss are the first line of treatment for diabetes.  Check labs today.  - Hemoglobin A1c - Insulin, random  4. Hypertension associated with diabetes (Sheila Guerra) [name] is working on healthy weight loss and exercise to improve blood pressure control. We will watch for signs of hypotension as she continues her lifestyle modifications. Check labs today.  - Comprehensive metabolic panel  5. Hyperlipidemia associated with type 2 diabetes mellitus (Sheila Guerra) Cardiovascular risk and specific lipid/LDL goals reviewed.  We discussed several lifestyle modifications today and Sheila Guerra will continue to work on diet, exercise and weight loss efforts. Orders and follow up as documented in patient record.   Counseling Intensive lifestyle modifications are the first line treatment for  this issue. Dietary changes: Increase soluble fiber. Decrease simple carbohydrates. Exercise changes: Moderate to vigorous-intensity aerobic activity 150 minutes per week if tolerated. Lipid-lowering medications: see documented in medical record. Check labs today.  - Lipid Panel With LDL/HDL Ratio  6. Obstructive sleep apnea Intensive lifestyle modifications are the first line treatment for this issue. We discussed several lifestyle modifications today and she will continue to work on diet, exercise and weight loss efforts. We will continue to monitor. Orders and follow up as documented in patient record. Follow up at next appt.  7. NAFLD (nonalcoholic fatty liver disease) We discussed the likely diagnosis of non-alcoholic fatty liver disease today and how this condition is obesity related. Sheila Guerra was educated the importance of weight loss. Sheila Guerra agreed to continue with her weight loss efforts with healthier diet and exercise as an essential part of her treatment plan. Check CMP today.  8. Vitamin D deficiency Low Vitamin D level contributes to fatigue and are associated with obesity, breast, and colon cancer. She agrees to continue to take prescription Vitamin D 50,000 IU every week and will follow-up for routine testing of Vitamin D, at least 2-3 times per year to avoid over-replacement. Check labs today.  - VITAMIN D 25 Hydroxy (Vit-D Deficiency, Fractures)  9. History of breast cancer Follow up with oncology as previously scheduled.  10. Depression screening Sheila Guerra had a positive  depression screening. Depression is commonly associated with obesity and often results in emotional eating behaviors. We will monitor this closely and work on CBT to help improve the non-hunger eating patterns. Referral to Psychology may be required if no improvement is seen as she continues in our clinic.  11. Obesity with current BMI of 34.9  Sheila Guerra is currently in the action stage of change and her goal is to  continue with weight loss efforts. I recommend Sheila Guerra begin the structured treatment plan as follows:  She has agreed to the Category 2 Plan + 100 calories with 6 oz protein at night and the Pescatarian Plan + 200 calories.  Exercise goals: No exercise has been prescribed at this time.   Behavioral modification strategies: increasing lean protein intake, meal planning and cooking strategies, keeping healthy foods in the home, and planning for success.  She was informed of the importance of frequent follow-up visits to maximize her success with intensive lifestyle modifications for her multiple health conditions. She was informed we would discuss her lab results at her next visit unless there is a critical issue that needs to be addressed sooner. Sheila Guerra agreed to keep her next visit at the agreed upon time to discuss these results.  Objective:   Blood pressure 137/87, pulse 72, temperature 98.1 F (36.7 C), height '5\' 2"'$  (1.575 m), weight 190 lb (86.2 kg), last menstrual period 09/14/2017, SpO2 98 %. Body mass index is 34.75 kg/m.  EKG: Normal sinus rhythm, rate 72.  Indirect Calorimeter completed today shows a VO2 of 226 and a REE of 1555.  Her calculated basal metabolic rate is [id number] thus her basal metabolic rate is worse than expected.  General: Cooperative, alert, well developed, in no acute distress. HEENT: Conjunctivae and lids unremarkable. Cardiovascular: Regular rhythm.  Lungs: Normal work of breathing. Neurologic: No focal deficits.   Lab Results  Component Value Date   CREATININE 0.63 03/29/2021   BUN 9 03/29/2021   NA 137 03/29/2021   K 4.6 03/29/2021   CL 101 03/29/2021   CO2 21 03/29/2021   Lab Results  Component Value Date   ALT 38 (H) 03/29/2021   AST 29 03/29/2021   ALKPHOS 84 03/29/2021   BILITOT 0.3 03/29/2021   Lab Results  Component Value Date   HGBA1C 6.8 (H) 03/29/2021   Lab Results  Component Value Date   INSULIN 53.3 (H) 03/29/2021   Lab Results   Component Value Date   TSH 2.220 03/29/2021   Lab Results  Component Value Date   CHOL 163 03/29/2021   HDL 49 03/29/2021   LDLCALC 87 03/29/2021   TRIG 154 (H) 03/29/2021   Lab Results  Component Value Date   WBC 8.0 03/29/2021   HGB 12.6 03/29/2021   HCT 38.0 03/29/2021   MCV 90 03/29/2021   PLT 272 03/29/2021   Attestation Statements:   Reviewed by clinician on day of visit: allergies, medications, problem list, medical history, surgical history, family history, social history, and previous encounter notes.  Time spent on visit including pre-visit chart review and post-visit charting and care was 60 minutes.   Coral Ceo, CMA, am acting as transcriptionist for Coralie Common, MD.  This is the patient's first visit at [facility]. The patient's NEW PATIENT PACKET was reviewed at length. Included in the packet: current and past health history, medications, allergies, ROS, gynecologic history (women only), surgical history, family history, social history, weight history, weight loss surgery history (for those that have  had weight loss surgery), nutritional evaluation, mood and food questionnaire, PHQ9, Epworth questionnaire, sleep habits questionnaire, patient life and health improvement goals questionnaire. These will all be scanned into the patient's chart under media.   During the visit, I independently reviewed the patient's EKG, bioimpedance scale results, and indirect calorimeter results. I used this information to tailor a meal plan for the patient that will help her to lose weight and will improve her obesity-related conditions going forward. I performed a medically necessary appropriate examination and/or evaluation. I discussed the assessment and treatment plan with the patient. The patient was provided an opportunity to ask questions and all were answered. The patient agreed with the plan and demonstrated an understanding of the instructions. Labs  were ordered at this visit and will be reviewed at the next visit unless more critical results need to be addressed immediately. Clinical information was updated and documented in the EMR.   Time spent on visit including pre-visit chart review and post-visit care was 60 minutes.   A separate 15 minutes was spent on risk counseling (see above).   I have reviewed the above documentation for accuracy and completeness, and I agree with the above. - Coralie Common, MD

## 2021-04-02 DIAGNOSIS — K760 Fatty (change of) liver, not elsewhere classified: Secondary | ICD-10-CM | POA: Diagnosis not present

## 2021-04-12 ENCOUNTER — Other Ambulatory Visit: Payer: Self-pay

## 2021-04-12 ENCOUNTER — Other Ambulatory Visit (INDEPENDENT_AMBULATORY_CARE_PROVIDER_SITE_OTHER): Payer: Self-pay | Admitting: Family Medicine

## 2021-04-12 ENCOUNTER — Ambulatory Visit (INDEPENDENT_AMBULATORY_CARE_PROVIDER_SITE_OTHER): Payer: BC Managed Care – PPO | Admitting: Family Medicine

## 2021-04-12 ENCOUNTER — Encounter (INDEPENDENT_AMBULATORY_CARE_PROVIDER_SITE_OTHER): Payer: Self-pay | Admitting: Family Medicine

## 2021-04-12 VITALS — BP 132/77 | HR 70 | Temp 97.6°F | Ht 62.0 in | Wt 188.0 lb

## 2021-04-12 DIAGNOSIS — E1165 Type 2 diabetes mellitus with hyperglycemia: Secondary | ICD-10-CM

## 2021-04-12 DIAGNOSIS — I152 Hypertension secondary to endocrine disorders: Secondary | ICD-10-CM

## 2021-04-12 DIAGNOSIS — Z9189 Other specified personal risk factors, not elsewhere classified: Secondary | ICD-10-CM | POA: Diagnosis not present

## 2021-04-12 DIAGNOSIS — E1169 Type 2 diabetes mellitus with other specified complication: Secondary | ICD-10-CM

## 2021-04-12 DIAGNOSIS — E1159 Type 2 diabetes mellitus with other circulatory complications: Secondary | ICD-10-CM | POA: Diagnosis not present

## 2021-04-12 DIAGNOSIS — K760 Fatty (change of) liver, not elsewhere classified: Secondary | ICD-10-CM

## 2021-04-12 DIAGNOSIS — E669 Obesity, unspecified: Secondary | ICD-10-CM

## 2021-04-12 DIAGNOSIS — E559 Vitamin D deficiency, unspecified: Secondary | ICD-10-CM | POA: Diagnosis not present

## 2021-04-12 DIAGNOSIS — Z6834 Body mass index (BMI) 34.0-34.9, adult: Secondary | ICD-10-CM

## 2021-04-12 DIAGNOSIS — E785 Hyperlipidemia, unspecified: Secondary | ICD-10-CM

## 2021-04-12 MED ORDER — TIRZEPATIDE 2.5 MG/0.5ML ~~LOC~~ SOAJ: 2.5000 mg | SUBCUTANEOUS | 0 refills | Status: DC

## 2021-04-12 NOTE — Telephone Encounter (Signed)
Could you please check PA? Thank you, CAS

## 2021-04-12 NOTE — Telephone Encounter (Signed)
Prior authorization has been started for Mounjar[employer]. Will notify patient and provider once response is received.  

## 2021-04-12 NOTE — Telephone Encounter (Signed)
Pt last seen by Dr. Ukleja.  

## 2021-04-12 NOTE — Progress Notes (Signed)
Chief Complaint:   OBESITY Sheila Guerra is here to discuss her progress with her obesity treatment plan along with follow-up of her obesity related diagnoses. Sheila Guerra is on the Category 2 Plan + 100 calories and the Pescatarian Plan + 200 calories and states she is following her eating plan approximately 75% of the time. Sheila Guerra states she is doing Zumba class 60 minutes 2-4 times per week.  Today's visit was #: 2 Starting weight: 190 lbs Starting date: 03/29/2021 Today's weight: 188 lbs Today's date: 04/12/2021 Total lbs lost to date: 2 Total lbs lost since last in-office visit: 2  Interim History: Sheila Guerra enjoyed the first 2 weeks on plan and felt full, especially with sandwich. Breakfast and lunch follows the plan but has increased cravings in mid afternoon. She is doing salad at dinner with 4 oz protein. Sheila Guerra is choosing string cheese or spoon of peanut butter in mid afternoon. Her foal for Labor Day is to organize her daughter's closet.  Subjective:   1. NAFLD (nonalcoholic fatty liver disease) Sheila Guerra's last AST was 29, ALT 38, and alkaline phosphate 84.  2. Type 2 diabetes mellitus with hyperglycemia, without long-term current use of insulin (Sheila Guerra) She is on Metformin. Never been on other meds.  3. Hypertension associated with diabetes (Sheila Guerra) BP well controlled. Pt denies chest pain/chest pressure/headache. Sheila Guerra is on labetalol 100 mg BID.   4. Vitamin D deficiency She denies nausea, vomiting, and muscle weakness but notes fatigue. Pt is on prescription Vit D.  5. Hyperlipidemia associated with type 2 diabetes mellitus (Sheila Guerra) Sheila Guerra is on Crestor 10 mg with no side effects. Her last LDL was 87, HDL 49, and triglycerides 154.  6. At risk for hyperglycemia Sheila Guerra is at risk for hyperglycemia due to diabetes.  Assessment/Plan:   1. NAFLD (nonalcoholic fatty liver disease) We discussed the likely diagnosis of non-alcoholic fatty liver disease today and how this condition is obesity  related. Sheila Guerra was educated the importance of weight loss. Sheila Guerra agreed to continue with her weight loss efforts with healthier diet and exercise as an essential part of her treatment plan. Repeat CMP in 3 months.  2. Type 2 diabetes mellitus with hyperglycemia, without long-term current use of insulin (Sheila Guerra) Risks and benefits of Mounjaro discussed and pt will start medication. Good blood sugar control is important to decrease the likelihood of diabetic complications such as nephropathy, neuropathy, limb loss, blindness, coronary artery disease, and death. Intensive lifestyle modification including diet, exercise and weight loss are the first line of treatment for diabetes.   Start- tirzepatide Sheila Guerra) 2.5 MG/0.5ML Pen; Inject 2.5 mg into the skin once a week.  Dispense: 2 mL; Refill: 0  3. Hypertension associated with diabetes (Sheila Guerra) Sheila Guerra is working on healthy weight loss and exercise to improve blood pressure control. We will watch for signs of hypotension as she continues her lifestyle modifications. Continue current treatment plan.  4. Vitamin D deficiency Low Vitamin D level contributes to fatigue and are associated with obesity, breast, and colon cancer. She agrees to continue to take prescription Vitamin D 50,000 IU every week and will follow-up for routine testing of Vitamin D, at least 2-3 times per year to avoid over-replacement.  5. Hyperlipidemia associated with type 2 diabetes mellitus (Sheila Guerra) Cardiovascular risk and specific lipid/LDL goals reviewed.  We discussed several lifestyle modifications today and Sheila Guerra will continue to work on diet, exercise and weight loss efforts. Orders and follow up as documented in patient record. Repeat labs in 3 months.  Counseling Intensive lifestyle modifications are the first line treatment for this issue. Dietary changes: Increase soluble fiber. Decrease simple carbohydrates. Exercise changes: Moderate to vigorous-intensity aerobic activity 150  minutes per week if tolerated. Lipid-lowering medications: see documented in medical record.  6. At risk for hyperglycemia Sheila Guerra was given approximately 30 minutes of counseling today regarding prevention of hyperglycemia. She was advised of hyperglycemia causes and the fact hyperglycemia is often asymptomatic. Sheila Guerra was instructed to avoid skipping meals, eat regular protein rich meals and schedule low calorie but protein rich snacks as needed.   Repetitive spaced learning was employed today to elicit superior memory formation and behavioral change  7. Obesity with current BMI of 34.5  Sheila Guerra is currently in the action stage of change. As such, her goal is to continue with weight loss efforts. She has agreed to the Category 2 Plan + 100 calories and the Pescatarian Plan + 200 calories.   Exercise goals: No exercise has been prescribed at this time.  Behavioral modification strategies: increasing lean protein intake, meal planning and cooking strategies, and keeping healthy foods in the home.  Sheila Guerra has agreed to follow-up with our clinic in 2 weeks. She was informed of the importance of frequent follow-up visits to maximize her success with intensive lifestyle modifications for her multiple health conditions.   Objective:   Blood pressure 132/77, pulse 70, temperature 97.6 F (36.4 C), height '5\' 2"'$  (1.575 m), weight 188 lb (85.3 kg), last menstrual period 09/14/2017, SpO2 99 %. Body mass index is 34.39 kg/m.  General: Cooperative, alert, well developed, in no acute distress. HEENT: Conjunctivae and lids unremarkable. Cardiovascular: Regular rhythm.  Lungs: Normal work of breathing. Neurologic: No focal deficits.   Lab Results  Component Value Date   CREATININE 0.63 03/29/2021   BUN 9 03/29/2021   NA 137 03/29/2021   K 4.6 03/29/2021   CL 101 03/29/2021   CO2 21 03/29/2021   Lab Results  Component Value Date   ALT 38 (H) 03/29/2021   AST 29 03/29/2021   ALKPHOS 84  03/29/2021   BILITOT 0.3 03/29/2021   Lab Results  Component Value Date   HGBA1C 6.8 (H) 03/29/2021   Lab Results  Component Value Date   INSULIN 53.3 (H) 03/29/2021   Lab Results  Component Value Date   TSH 2.220 03/29/2021   Lab Results  Component Value Date   CHOL 163 03/29/2021   HDL 49 03/29/2021   LDLCALC 87 03/29/2021   TRIG 154 (H) 03/29/2021   Lab Results  Component Value Date   VD25OH 38.4 03/29/2021   Lab Results  Component Value Date   WBC 8.0 03/29/2021   HGB 12.6 03/29/2021   HCT 38.0 03/29/2021   MCV 90 03/29/2021   PLT 272 03/29/2021    Attestation Statements:   Reviewed by clinician on day of visit: allergies, medications, problem list, medical history, surgical history, family history, social history, and previous encounter notes.  Coral Ceo, CMA, am acting as transcriptionist for Coralie Common, MD.   I have reviewed the above documentation for accuracy and completeness, and I agree with the above. - Coralie Common, MD

## 2021-04-13 ENCOUNTER — Encounter (INDEPENDENT_AMBULATORY_CARE_PROVIDER_SITE_OTHER): Payer: Self-pay

## 2021-04-17 DIAGNOSIS — K760 Fatty (change of) liver, not elsewhere classified: Secondary | ICD-10-CM | POA: Diagnosis not present

## 2021-04-17 DIAGNOSIS — E1140 Type 2 diabetes mellitus with diabetic neuropathy, unspecified: Secondary | ICD-10-CM | POA: Diagnosis not present

## 2021-04-17 DIAGNOSIS — Z6834 Body mass index (BMI) 34.0-34.9, adult: Secondary | ICD-10-CM | POA: Diagnosis not present

## 2021-04-17 DIAGNOSIS — E559 Vitamin D deficiency, unspecified: Secondary | ICD-10-CM | POA: Diagnosis not present

## 2021-04-17 DIAGNOSIS — E782 Mixed hyperlipidemia: Secondary | ICD-10-CM | POA: Diagnosis not present

## 2021-04-17 DIAGNOSIS — I10 Essential (primary) hypertension: Secondary | ICD-10-CM | POA: Diagnosis not present

## 2021-04-17 DIAGNOSIS — Z79899 Other long term (current) drug therapy: Secondary | ICD-10-CM | POA: Diagnosis not present

## 2021-04-17 DIAGNOSIS — Z1159 Encounter for screening for other viral diseases: Secondary | ICD-10-CM | POA: Diagnosis not present

## 2021-04-27 ENCOUNTER — Encounter (INDEPENDENT_AMBULATORY_CARE_PROVIDER_SITE_OTHER): Payer: Self-pay | Admitting: Family Medicine

## 2021-04-27 ENCOUNTER — Ambulatory Visit (INDEPENDENT_AMBULATORY_CARE_PROVIDER_SITE_OTHER): Payer: BC Managed Care – PPO | Admitting: Family Medicine

## 2021-04-27 ENCOUNTER — Other Ambulatory Visit: Payer: Self-pay

## 2021-04-27 VITALS — BP 142/81 | HR 69 | Temp 98.1°F | Ht 62.0 in | Wt 183.0 lb

## 2021-04-27 DIAGNOSIS — E1165 Type 2 diabetes mellitus with hyperglycemia: Secondary | ICD-10-CM | POA: Diagnosis not present

## 2021-04-27 DIAGNOSIS — E669 Obesity, unspecified: Secondary | ICD-10-CM | POA: Diagnosis not present

## 2021-04-27 DIAGNOSIS — E1169 Type 2 diabetes mellitus with other specified complication: Secondary | ICD-10-CM | POA: Diagnosis not present

## 2021-04-27 DIAGNOSIS — Z9189 Other specified personal risk factors, not elsewhere classified: Secondary | ICD-10-CM | POA: Diagnosis not present

## 2021-04-27 DIAGNOSIS — E785 Hyperlipidemia, unspecified: Secondary | ICD-10-CM

## 2021-04-27 DIAGNOSIS — Z6834 Body mass index (BMI) 34.0-34.9, adult: Secondary | ICD-10-CM

## 2021-04-27 MED ORDER — TIRZEPATIDE 2.5 MG/0.5ML ~~LOC~~ SOAJ: 2.5000 mg | SUBCUTANEOUS | 0 refills | Status: DC

## 2021-04-27 NOTE — Progress Notes (Signed)
Chief Complaint:   OBESITY Sheila Guerra is here to discuss her progress with her obesity treatment plan along with follow-up of her obesity related diagnoses. Sheila Guerra is on the Category 2 Plan + 100 calories and the Pescatarian Plan + 200 calories and states she is following her eating plan approximately 80% of the time. Sheila Guerra states she is doing Zumba 60 minutes 3-4 times per week.  Today's visit was #: 3 Starting weight: 190 lbs Starting date: 03/29/2021 Today's weight: 183 lbs Today's date: 04/27/2021 Total lbs lost to date: 7 Total lbs lost since last in-office visit: 5  Interim History: Sheila Guerra is noticing a change in BS numbers with the start of Mounjaro. She occasionally feels GI upset in the evening. Pt is hoping for rest for her birthday. She thinks she may dine out for her birthday. Pt is able to get all food in. She's following snack list for options and reports decreased hunger and cravings.  Subjective:   1. Type 2 diabetes mellitus with hyperglycemia, without long-term current use of insulin ([name]) Sheila Guerra is on Mounjaro 2.5 mg with good appetite and craving control. She denies GI side effects.  2. Hyperlipidemia associated with type 2 diabetes mellitus (Sheila Guerra) Pt is on Crestor with no myalgias. Minimally elevation of ALT.  3. At risk for heart disease Sheila Guerra is at a higher than average risk for cardiovascular disease due to obesity.   Assessment/Plan:   1. Type 2 diabetes mellitus with hyperglycemia, without long-term current use of insulin ([name]) Good blood sugar control is important to decrease the likelihood of diabetic complications such as nephropathy, neuropathy, limb loss, blindness, coronary artery disease, and death. Intensive lifestyle modification including diet, exercise and weight loss are the first line of treatment for diabetes.   Refill- tirzepatide (MOUNJARO) 2.5 MG/0.5ML Pen; Inject 2.5 mg into the skin once a week.  Dispense: 2 mL; Refill: 0  2. Hyperlipidemia  associated with type 2 diabetes mellitus ([place]) Cardiovascular risk and specific lipid/LDL goals reviewed.  We discussed several lifestyle modifications today and Sheila Guerra will continue to work on diet, exercise and weight loss efforts. Orders and follow up as documented in patient record. Continue Crestor and repeat labs in 3 months.  Counseling Intensive lifestyle modifications are the first line treatment for this issue. Dietary changes: Increase soluble fiber. Decrease simple carbohydrates. Exercise changes: Moderate to vigorous-intensity aerobic activity 150 minutes per week if tolerated. Lipid-lowering medications: see documented in medical record.  3. At risk for heart disease Sheila Guerra was given approximately 15 minutes of coronary artery disease prevention counseling today. She is 45 y.o. female and has risk factors for heart disease including obesity. We discussed intensive lifestyle modifications today with an emphasis on specific weight loss instructions and strategies.   Repetitive spaced learning was employed today to elicit superior memory formation and behavioral change.  4. Obesity with current BMI of 33.6  Sheila Guerra is currently in the action stage of change. As such, her goal is to continue with weight loss efforts. She has agreed to the Category 2 Plan + 100 calories and the Pescatarian Plan + 200 calories.   Exercise goals:  As is  Behavioral modification strategies: increasing lean protein intake, meal planning and cooking strategies, and celebration eating strategies.  Sheila Guerra has agreed to follow-up with our clinic in 2 weeks. She was informed of the importance of frequent follow-up visits to maximize her success with intensive lifestyle modifications for her multiple health conditions.   Objective:   Blood  pressure (!) 142/81, pulse 69, temperature 98.1 F (36.7 C), height '5\' 2"'$  (1.575 m), weight 183 lb (83 kg), last menstrual period 09/14/2017, SpO2 98 %. Body mass index is  33.47 kg/m.  General: Cooperative, alert, well developed, in no acute distress. HEENT: Conjunctivae and lids unremarkable. Cardiovascular: Regular rhythm.  Lungs: Normal work of breathing. Neurologic: No focal deficits.   Lab Results  Component Value Date   CREATININE 0.63 03/29/2021   BUN 9 03/29/2021   NA 137 03/29/2021   K 4.6 03/29/2021   CL 101 03/29/2021   CO2 21 03/29/2021   Lab Results  Component Value Date   ALT 38 (H) 03/29/2021   AST 29 03/29/2021   ALKPHOS 84 03/29/2021   BILITOT 0.3 03/29/2021   Lab Results  Component Value Date   HGBA1C 6.8 (H) 03/29/2021   Lab Results  Component Value Date   INSULIN 53.3 (H) 03/29/2021   Lab Results  Component Value Date   TSH 2.220 03/29/2021   Lab Results  Component Value Date   CHOL 163 03/29/2021   HDL 49 03/29/2021   LDLCALC 87 03/29/2021   TRIG 154 (H) 03/29/2021   Lab Results  Component Value Date   VD25OH 38.4 03/29/2021   Lab Results  Component Value Date   WBC 8.0 03/29/2021   HGB 12.6 03/29/2021   HCT 38.0 03/29/2021   MCV 90 03/29/2021   PLT 272 03/29/2021   Attestation Statements:   Reviewed by clinician on day of visit: allergies, medications, problem list, medical history, surgical history, family history, social history, and previous encounter notes.  Coral Ceo, CMA, am acting as transcriptionist for Coralie Common, MD.   I have reviewed the above documentation for accuracy and completeness, and I agree with the above. - Coralie Common, MD

## 2021-05-11 ENCOUNTER — Other Ambulatory Visit: Payer: Self-pay

## 2021-05-11 ENCOUNTER — Encounter (INDEPENDENT_AMBULATORY_CARE_PROVIDER_SITE_OTHER): Payer: Self-pay | Admitting: Family Medicine

## 2021-05-11 ENCOUNTER — Ambulatory Visit (INDEPENDENT_AMBULATORY_CARE_PROVIDER_SITE_OTHER): Payer: BC Managed Care – PPO | Admitting: Family Medicine

## 2021-05-11 VITALS — BP 152/85 | HR 66 | Temp 97.8°F | Ht 62.0 in | Wt 182.0 lb

## 2021-05-11 DIAGNOSIS — E1165 Type 2 diabetes mellitus with hyperglycemia: Secondary | ICD-10-CM | POA: Diagnosis not present

## 2021-05-11 DIAGNOSIS — I152 Hypertension secondary to endocrine disorders: Secondary | ICD-10-CM

## 2021-05-11 DIAGNOSIS — Z6834 Body mass index (BMI) 34.0-34.9, adult: Secondary | ICD-10-CM

## 2021-05-11 DIAGNOSIS — Z9189 Other specified personal risk factors, not elsewhere classified: Secondary | ICD-10-CM

## 2021-05-11 DIAGNOSIS — E669 Obesity, unspecified: Secondary | ICD-10-CM | POA: Diagnosis not present

## 2021-05-11 DIAGNOSIS — E1159 Type 2 diabetes mellitus with other circulatory complications: Secondary | ICD-10-CM

## 2021-05-11 MED ORDER — TIRZEPATIDE 5 MG/0.5ML ~~LOC~~ SOAJ: 5.0000 mg | SUBCUTANEOUS | 0 refills | Status: DC

## 2021-05-11 NOTE — Progress Notes (Signed)
Chief Complaint:   OBESITY Sheila Guerra is here to discuss her progress with her obesity treatment plan along with follow-up of her obesity related diagnoses. Sheila Guerra is on the Category 2 Plan + 100 calories and the Pescatarian Plan + 200 calories and states she is following her eating plan approximately 75% of the time. Sheila Guerra states she is doing Zumba 60 minutes 2-4 times per week.  Today's visit was #: 4 Starting weight: 190 lbs Starting date: 03/29/2021 Today's weight: 182 lbs Today's date: 05/11/2021 Total lbs lost to date: 8 Total lbs lost since last in-office visit: 1  Interim History: Sheila Guerra had a good few weeks. She notes she went out frequently and thinks she may have overeaten- ate mostly meat. Likely the [facility]. She gravitates toward Pescatarian meal plan but not necessarily hunger but has cravings. For snack calories, she is eating cheese sticks or yogurt or saltine crackers with peanut butter.  Subjective:   1. Type 2 diabetes mellitus with hyperglycemia, without long-term current use of insulin ([name]) Sheila Guerra is on Mounjaro 2.5 mg but is able to get all food in. Her last A1c was 6.8 and insulin level 53.3.   2. Hypertension associated with diabetes ([place]) BP elevated today. Pt denies chest pain/chest pressure/headache. She was running to get here on time.  3. At risk for side effect of medication Sheila Guerra is at risk for side effects of medication due to increasing dose of Mounjaro.  Assessment/Plan:   1. Type 2 diabetes mellitus with hyperglycemia, without long-term current use of insulin ([name]) Good blood sugar control is important to decrease the likelihood of diabetic complications such as nephropathy, neuropathy, limb loss, blindness, coronary artery disease, and death. Intensive lifestyle modification including diet, exercise and weight loss are the first line of treatment for diabetes. Continue Mounjaro but increase to 5 mg as directed.  Increase and Refill- tirzepatide  (MOUNJARO) 5 MG/0.5ML Pen; Inject 5 mg into the skin once a week.  Dispense: 6 mL; Refill: 0  2. Hypertension associated with diabetes ([place]) Sheila Guerra is working on healthy weight loss and exercise to improve blood pressure control. We will watch for signs of hypotension as she continues her lifestyle modifications. Continue current treatment plan. No refill needed at this time.  3. At risk for side effect of medication Sheila Guerra was given approximately 15 minutes of drug side effect counseling today.  We discussed side effect possibility and risk versus benefits. Sheila Guerra agreed to the medication and will contact this office if these side effects are intolerable.  Repetitive spaced learning was employed today to elicit superior memory formation and behavioral change.   4. Obesity with current BMI of 33.3  Sheila Guerra is currently in the action stage of change. As such, her goal is to continue with weight loss efforts. She has agreed to the Category 2 Plan + 100 calories and the Pescatarian Plan + 200 calories.   Exercise goals:  As is  Behavioral modification strategies: increasing lean protein intake, decreasing eating out, meal planning and cooking strategies, and planning for success.  Sheila Guerra has agreed to follow-up with our clinic in 3 weeks. She was informed of the importance of frequent follow-up visits to maximize her success with intensive lifestyle modifications for her multiple health conditions.   Objective:   Blood pressure (!) 152/85, pulse 66, temperature 97.8 F (36.6 C), height 5\' 2"  (1.575 m), weight 182 lb (82.6 kg), last menstrual period 09/14/2017, SpO2 99 %. Body mass index is 33.29 kg/m.  General:  Cooperative, alert, well developed, in no acute distress. HEENT: Conjunctivae and lids unremarkable. Cardiovascular: Regular rhythm.  Lungs: Normal work of breathing. Neurologic: No focal deficits.   Lab Results  Component Value Date   CREATININE 0.63 03/29/2021   BUN 9 03/29/2021    NA 137 03/29/2021   K 4.6 03/29/2021   CL 101 03/29/2021   CO2 21 03/29/2021   Lab Results  Component Value Date   ALT 38 (H) 03/29/2021   AST 29 03/29/2021   ALKPHOS 84 03/29/2021   BILITOT 0.3 03/29/2021   Lab Results  Component Value Date   HGBA1C 6.8 (H) 03/29/2021   Lab Results  Component Value Date   INSULIN 53.3 (H) 03/29/2021   Lab Results  Component Value Date   TSH 2.220 03/29/2021   Lab Results  Component Value Date   CHOL 163 03/29/2021   HDL 49 03/29/2021   LDLCALC 87 03/29/2021   TRIG 154 (H) 03/29/2021   Lab Results  Component Value Date   VD25OH 38.4 03/29/2021   Lab Results  Component Value Date   WBC 8.0 03/29/2021   HGB 12.6 03/29/2021   HCT 38.0 03/29/2021   MCV 90 03/29/2021   PLT 272 03/29/2021    Attestation Statements:   Reviewed by clinician on day of visit: allergies, medications, problem list, medical history, surgical history, family history, social history, and previous encounter notes.  Coral Ceo, CMA, am acting as transcriptionist for Coralie Common, MD.   I have reviewed the above documentation for accuracy and completeness, and I agree with the above. - Coralie Common, MD

## 2021-05-31 DIAGNOSIS — G4733 Obstructive sleep apnea (adult) (pediatric): Secondary | ICD-10-CM | POA: Diagnosis not present

## 2021-06-01 ENCOUNTER — Ambulatory Visit (INDEPENDENT_AMBULATORY_CARE_PROVIDER_SITE_OTHER): Payer: BC Managed Care – PPO | Admitting: Family Medicine

## 2021-06-08 DIAGNOSIS — Z853 Personal history of malignant neoplasm of breast: Secondary | ICD-10-CM | POA: Diagnosis not present

## 2021-06-08 DIAGNOSIS — Z9013 Acquired absence of bilateral breasts and nipples: Secondary | ICD-10-CM | POA: Diagnosis not present

## 2021-06-08 DIAGNOSIS — Z1151 Encounter for screening for human papillomavirus (HPV): Secondary | ICD-10-CM | POA: Diagnosis not present

## 2021-06-08 DIAGNOSIS — Z01419 Encounter for gynecological examination (general) (routine) without abnormal findings: Secondary | ICD-10-CM | POA: Diagnosis not present

## 2021-06-22 ENCOUNTER — Encounter (INDEPENDENT_AMBULATORY_CARE_PROVIDER_SITE_OTHER): Payer: Self-pay | Admitting: Family Medicine

## 2021-06-22 ENCOUNTER — Other Ambulatory Visit: Payer: Self-pay

## 2021-06-22 ENCOUNTER — Ambulatory Visit (INDEPENDENT_AMBULATORY_CARE_PROVIDER_SITE_OTHER): Payer: BC Managed Care – PPO | Admitting: Family Medicine

## 2021-06-22 VITALS — BP 134/82 | HR 75 | Temp 97.6°F | Ht 62.0 in | Wt 174.0 lb

## 2021-06-22 DIAGNOSIS — E1169 Type 2 diabetes mellitus with other specified complication: Secondary | ICD-10-CM

## 2021-06-22 DIAGNOSIS — E785 Hyperlipidemia, unspecified: Secondary | ICD-10-CM | POA: Diagnosis not present

## 2021-06-22 DIAGNOSIS — E669 Obesity, unspecified: Secondary | ICD-10-CM

## 2021-06-22 DIAGNOSIS — E1165 Type 2 diabetes mellitus with hyperglycemia: Secondary | ICD-10-CM

## 2021-06-22 DIAGNOSIS — Z6834 Body mass index (BMI) 34.0-34.9, adult: Secondary | ICD-10-CM

## 2021-06-22 DIAGNOSIS — E66811 Obesity, class 1: Secondary | ICD-10-CM

## 2021-06-22 NOTE — Progress Notes (Signed)
Chief Complaint:   OBESITY Sheila Guerra is here to discuss her progress with her obesity treatment plan along with follow-up of her obesity related diagnoses. Sheila Guerra is on the Category 2 Plan + 100 calories and the Pescatarian Plan + 200 calories and states she is following her eating plan approximately 75% of the time. Sheila Guerra states she is not currently exercising.  Today's visit was #: 5 Starting weight: 190 lbs Starting date: 03/29/2021 Today's weight: 174 lbs Today's date: 06/22/2021 Total lbs lost to date: 16 Total lbs lost since last in-office visit: 8  Interim History: Sheila Guerra is enjoying Mounjaro but initially had some stomach upset that is much better now. She notices that certain time of day she cannot eat that much, particularly after 6 PM. Pt is prepping for upcoming vacation over Christmas. She likes the options of both meal plans.  Subjective:   1. Type 2 diabetes mellitus with hyperglycemia, without long-term current use of insulin (Sheila Guerra) Sheila Guerra is doing well on Mounjaro. Her last A1c was 6.8 and insulin level 53.3.  2. Hyperlipidemia associated with type 2 diabetes mellitus ([place]) Sheila Guerra is on Crestor. Her last triglycerides level was 154, HDL 49, and LDL 87.  Assessment/Plan:   1. Type 2 diabetes mellitus with hyperglycemia, without long-term current use of insulin ([name]) Good blood sugar control is important to decrease the likelihood of diabetic complications such as nephropathy, neuropathy, limb loss, blindness, coronary artery disease, and death. Intensive lifestyle modification including diet, exercise and weight loss are the first line of treatment for diabetes. Continue Mounjaro. No refill needed today.  2. Hyperlipidemia associated with type 2 diabetes mellitus ([place]) Cardiovascular risk and specific lipid/LDL goals reviewed.  We discussed several lifestyle modifications today and Sheila Guerra will continue to work on diet, exercise and weight loss efforts. Orders and follow up as  documented in patient record. Continue Crestor. No refill needed today.  Counseling Intensive lifestyle modifications are the first line treatment for this issue. Dietary changes: Increase soluble fiber. Decrease simple carbohydrates. Exercise changes: Moderate to vigorous-intensity aerobic activity 150 minutes per week if tolerated. Lipid-lowering medications: see documented in medical record.  3. Obesity with current BMI of 31.9  Sheila Guerra is currently in the action stage of change. As such, her goal is to continue with weight loss efforts. She has agreed to the Category 2 Plan + 100 calories and the Pescatarian Plan + 200 calories.   Exercise goals:  As is  Behavioral modification strategies: increasing lean protein intake, meal planning and cooking strategies, and keeping healthy foods in the home.  Sheila Guerra has agreed to follow-up with our clinic in 3 weeks. She was informed of the importance of frequent follow-up visits to maximize her success with intensive lifestyle modifications for her multiple health conditions.   Objective:   Blood pressure 134/82, pulse 75, temperature 97.6 F (36.4 C), height 5\' 2"  (1.575 m), weight 174 lb (78.9 kg), last menstrual period 09/14/2017, SpO2 97 %. Body mass index is 31.83 kg/m.  General: Cooperative, alert, well developed, in no acute distress. HEENT: Conjunctivae and lids unremarkable. Cardiovascular: Regular rhythm.  Lungs: Normal work of breathing. Neurologic: No focal deficits.   Lab Results  Component Value Date   CREATININE 0.63 03/29/2021   BUN 9 03/29/2021   NA 137 03/29/2021   K 4.6 03/29/2021   CL 101 03/29/2021   CO2 21 03/29/2021   Lab Results  Component Value Date   ALT 38 (H) 03/29/2021   AST 29 03/29/2021  ALKPHOS 84 03/29/2021   BILITOT 0.3 03/29/2021   Lab Results  Component Value Date   HGBA1C 6.8 (H) 03/29/2021   Lab Results  Component Value Date   INSULIN 53.3 (H) 03/29/2021   Lab Results  Component  Value Date   TSH 2.220 03/29/2021   Lab Results  Component Value Date   CHOL 163 03/29/2021   HDL 49 03/29/2021   LDLCALC 87 03/29/2021   TRIG 154 (H) 03/29/2021   Lab Results  Component Value Date   VD25OH 38.4 03/29/2021   Lab Results  Component Value Date   WBC 8.0 03/29/2021   HGB 12.6 03/29/2021   HCT 38.0 03/29/2021   MCV 90 03/29/2021   PLT 272 03/29/2021    Attestation Statements:   Reviewed by clinician on day of visit: allergies, medications, problem list, medical history, surgical history, family history, social history, and previous encounter notes.  Time spent on visit including pre-visit chart review and post-visit care and charting was 15 minutes.   Coral Ceo, CMA, am acting as transcriptionist for Coralie Common, MD.   I have reviewed the above documentation for accuracy and completeness, and I agree with the above. - Coralie Common, MD

## 2021-06-28 DIAGNOSIS — H1032 Unspecified acute conjunctivitis, left eye: Secondary | ICD-10-CM | POA: Diagnosis not present

## 2021-06-28 DIAGNOSIS — E669 Obesity, unspecified: Secondary | ICD-10-CM | POA: Diagnosis not present

## 2021-06-28 DIAGNOSIS — I10 Essential (primary) hypertension: Secondary | ICD-10-CM | POA: Diagnosis not present

## 2021-06-28 DIAGNOSIS — E1140 Type 2 diabetes mellitus with diabetic neuropathy, unspecified: Secondary | ICD-10-CM | POA: Diagnosis not present

## 2021-06-28 DIAGNOSIS — E782 Mixed hyperlipidemia: Secondary | ICD-10-CM | POA: Diagnosis not present

## 2021-06-28 DIAGNOSIS — Z6832 Body mass index (BMI) 32.0-32.9, adult: Secondary | ICD-10-CM | POA: Diagnosis not present

## 2021-07-01 DIAGNOSIS — G4733 Obstructive sleep apnea (adult) (pediatric): Secondary | ICD-10-CM | POA: Diagnosis not present

## 2021-07-11 DIAGNOSIS — E8940 Asymptomatic postprocedural ovarian failure: Secondary | ICD-10-CM | POA: Diagnosis not present

## 2021-07-20 ENCOUNTER — Ambulatory Visit (INDEPENDENT_AMBULATORY_CARE_PROVIDER_SITE_OTHER): Payer: BC Managed Care – PPO | Admitting: Family Medicine

## 2021-07-20 ENCOUNTER — Other Ambulatory Visit: Payer: Self-pay

## 2021-07-20 ENCOUNTER — Encounter (INDEPENDENT_AMBULATORY_CARE_PROVIDER_SITE_OTHER): Payer: Self-pay | Admitting: Family Medicine

## 2021-07-20 VITALS — BP 125/79 | HR 76 | Temp 98.1°F | Ht 62.0 in | Wt 173.0 lb

## 2021-07-20 DIAGNOSIS — E1165 Type 2 diabetes mellitus with hyperglycemia: Secondary | ICD-10-CM

## 2021-07-20 DIAGNOSIS — E669 Obesity, unspecified: Secondary | ICD-10-CM

## 2021-07-20 DIAGNOSIS — I152 Hypertension secondary to endocrine disorders: Secondary | ICD-10-CM | POA: Diagnosis not present

## 2021-07-20 DIAGNOSIS — Z6834 Body mass index (BMI) 34.0-34.9, adult: Secondary | ICD-10-CM

## 2021-07-20 DIAGNOSIS — E1159 Type 2 diabetes mellitus with other circulatory complications: Secondary | ICD-10-CM

## 2021-07-20 MED ORDER — TIRZEPATIDE 5 MG/0.5ML ~~LOC~~ SOAJ: 5.0000 mg | SUBCUTANEOUS | 0 refills | Status: DC

## 2021-07-20 NOTE — Progress Notes (Signed)
Chief Complaint:   OBESITY Sheila Guerra is here to discuss her progress with her obesity treatment plan along with follow-up of her obesity related diagnoses. Sheila Guerra is on the Category 2 Plan + 100 calories and the Pescatarian Plan + 200 calories and states she is following her eating plan approximately 75% of the time. Sheila Guerra states she is doing Zumba 60 minutes 2 times per week.  Today's visit was #: 6 Starting weight: 190 lbs Starting date: 03/29/2021 Today's weight: 173 lbs Today's date: 07/20/2021 Total lbs lost to date: 17 Total lbs lost since last in-office visit: 1  Interim History: Sheila Guerra realizes there are more sweets around with the holiday season. She is following the meal plan the majority of the time. She eats a bit of rice occasionally and wants to substitute some beans for meat in the form of homemade soups. She is leaving in 2 weeks for her vacation. Cravings normally peak in the afternoon (using snack calories for when she has cravings).  Subjective:   1. Type 2 diabetes mellitus with hyperglycemia, without long-term current use of insulin ([place]) Sheila Guerra is on Glucophage and Mounjaro. Her last A1c was 6.8.  2. Hypertension associated with diabetes ([place]) Pt is on Crestor 10 mg and denies myalgias or transaminitis.  Assessment/Plan:   1. Type 2 diabetes mellitus with hyperglycemia, without long-term current use of insulin ([name]) Good blood sugar control is important to decrease the likelihood of diabetic complications such as nephropathy, neuropathy, limb loss, blindness, coronary artery disease, and death. Intensive lifestyle modification including diet, exercise and weight loss are the first line of treatment for diabetes.   Refill- tirzepatide (MOUNJARO) 5 MG/0.5ML Pen; Inject 5 mg into the skin once a week.  Dispense: 6 mL; Refill: 0  2. Hypertension associated with diabetes ([place]) Sheila Guerra is working on healthy weight loss and exercise to improve blood pressure control. We  will watch for signs of hypotension as she continues her lifestyle modifications. Continue Crestor. Not at goal.  3. Obesity with current BMI of 31.8  Sheila Guerra is currently in the action stage of change. As such, her goal is to continue with weight loss efforts. She has agreed to the Category 2 Plan + 100 calories and the Pescatarian Plan + 200 calories.   Exercise goals:  As is  Behavioral modification strategies: increasing lean protein intake, meal planning and cooking strategies, keeping healthy foods in the home, travel eating strategies, and holiday eating strategies .  Sheila Guerra has agreed to follow-up with our clinic in 4 weeks. She was informed of the importance of frequent follow-up visits to maximize her success with intensive lifestyle modifications for her multiple health conditions.   Objective:   Blood pressure 125/79, pulse 76, temperature 98.1 F (36.7 C), height 5\' 2"  (1.575 m), weight 173 lb (78.5 kg), last menstrual period 09/14/2017, SpO2 99 %. Body mass index is 31.64 kg/m.  General: Cooperative, alert, well developed, in no acute distress. HEENT: Conjunctivae and lids unremarkable. Cardiovascular: Regular rhythm.  Lungs: Normal work of breathing. Neurologic: No focal deficits.   Lab Results  Component Value Date   CREATININE 0.63 03/29/2021   BUN 9 03/29/2021   NA 137 03/29/2021   K 4.6 03/29/2021   CL 101 03/29/2021   CO2 21 03/29/2021   Lab Results  Component Value Date   ALT 38 (H) 03/29/2021   AST 29 03/29/2021   ALKPHOS 84 03/29/2021   BILITOT 0.3 03/29/2021   Lab Results  Component Value Date  HGBA1C 6.8 (H) 03/29/2021   Lab Results  Component Value Date   INSULIN 53.3 (H) 03/29/2021   Lab Results  Component Value Date   TSH 2.220 03/29/2021   Lab Results  Component Value Date   CHOL 163 03/29/2021   HDL 49 03/29/2021   LDLCALC 87 03/29/2021   TRIG 154 (H) 03/29/2021   Lab Results  Component Value Date   VD25OH 38.4 03/29/2021    Lab Results  Component Value Date   WBC 8.0 03/29/2021   HGB 12.6 03/29/2021   HCT 38.0 03/29/2021   MCV 90 03/29/2021   PLT 272 03/29/2021    Attestation Statements:   Reviewed by clinician on day of visit: allergies, medications, problem list, medical history, surgical history, family history, social history, and previous encounter notes.  Coral Ceo, CMA, am acting as transcriptionist for Coralie Common, MD.   I have reviewed the above documentation for accuracy and completeness, and I agree with the above. - Coralie Common, MD

## 2021-07-31 DIAGNOSIS — G4733 Obstructive sleep apnea (adult) (pediatric): Secondary | ICD-10-CM | POA: Diagnosis not present

## 2021-08-24 ENCOUNTER — Encounter (INDEPENDENT_AMBULATORY_CARE_PROVIDER_SITE_OTHER): Payer: Self-pay | Admitting: Family Medicine

## 2021-08-24 ENCOUNTER — Ambulatory Visit (INDEPENDENT_AMBULATORY_CARE_PROVIDER_SITE_OTHER): Payer: BC Managed Care – PPO | Admitting: Family Medicine

## 2021-08-24 ENCOUNTER — Other Ambulatory Visit: Payer: Self-pay

## 2021-08-24 VITALS — BP 139/80 | HR 75 | Temp 97.6°F | Ht 62.0 in | Wt 171.0 lb

## 2021-08-24 DIAGNOSIS — E559 Vitamin D deficiency, unspecified: Secondary | ICD-10-CM

## 2021-08-24 DIAGNOSIS — Z6834 Body mass index (BMI) 34.0-34.9, adult: Secondary | ICD-10-CM

## 2021-08-24 DIAGNOSIS — E669 Obesity, unspecified: Secondary | ICD-10-CM

## 2021-08-24 DIAGNOSIS — E1165 Type 2 diabetes mellitus with hyperglycemia: Secondary | ICD-10-CM

## 2021-08-24 DIAGNOSIS — Z6831 Body mass index (BMI) 31.0-31.9, adult: Secondary | ICD-10-CM

## 2021-08-25 NOTE — Progress Notes (Signed)
Chief Complaint:   OBESITY Sheila Guerra is here to discuss her progress with her obesity treatment plan along with follow-up of her obesity related diagnoses. Sheila Guerra is on the Category 2 Plan + 100 calories and the Pescatarian Plan + 200 calories and states she is following her eating plan approximately 50% of the time. Sheila Guerra states she is not currently exercising.  Today's visit was #: 7 Starting weight: 190 lbs Starting date: 03/29/2021 Today's weight: 171 lbs Today's date: 08/24/2021 Total lbs lost to date: 19 Total lbs lost since last in-office visit: 2  Interim History: Pt traveled over the holidays and only minimally affected by weather. She did not follow plan as strictly while away but did try to get back on plan when returning home. Pt has no upcoming obstacles or plans. She thinks the easiest plan to follow will be the plans she is already on.  Subjective:   1. Vitamin D deficiency Pt denies nausea, vomiting, and muscle weakness but notes fatigue. She is on prescription Vit D.  2. Type 2 diabetes mellitus with hyperglycemia, without long-term current use of insulin ([name]) Pt is doing extremely well on Metformin and Mounjaro with no GI side effects.  Assessment/Plan:   1. Vitamin D deficiency Low Vitamin D level contributes to fatigue and are associated with obesity, breast, and colon cancer. She agrees to continue to take prescription Vitamin D 50,000 IU every week and will follow-up for routine testing of Vitamin D, at least 2-3 times per year to avoid over-replacement.  2. Type 2 diabetes mellitus with hyperglycemia, without long-term current use of insulin ([name]) Good blood sugar control is important to decrease the likelihood of diabetic complications such as nephropathy, neuropathy, limb loss, blindness, coronary artery disease, and death. Intensive lifestyle modification including diet, exercise and weight loss are the first line of treatment for diabetes. Continue current  treatment plan. Not at goal yet. Labs at next appt.  3. Obesity with current BMI of 31.3  [name] is currently in the action stage of change. As such, her goal is to continue with weight loss efforts. She has agreed to the Category 2 Plan + 100 calories and the Pescatarian Plan + 200 calories.   Exercise goals: All adults should avoid inactivity. Some physical activity is better than none, and adults who participate in any amount of physical activity gain some health benefits.  Behavioral modification strategies: increasing lean protein intake, decreasing simple carbohydrates, meal planning and cooking strategies, and keeping healthy foods in the home.  Sheila Guerra has agreed to follow-up with our clinic in 3 weeks. She was informed of the importance of frequent follow-up visits to maximize her success with intensive lifestyle modifications for her multiple health conditions.   Objective:   Blood pressure 139/80, pulse 75, temperature 97.6 F (36.4 C), height 5\' 2"  (1.575 m), weight 171 lb (77.6 kg), last menstrual period 09/14/2017, SpO2 100 %. Body mass index is 31.28 kg/m.  General: Cooperative, alert, well developed, in no acute distress. HEENT: Conjunctivae and lids unremarkable. Cardiovascular: Regular rhythm.  Lungs: Normal work of breathing. Neurologic: No focal deficits.   Lab Results  Component Value Date   CREATININE 0.63 03/29/2021   BUN 9 03/29/2021   NA 137 03/29/2021   K 4.6 03/29/2021   CL 101 03/29/2021   CO2 21 03/29/2021   Lab Results  Component Value Date   ALT 38 (H) 03/29/2021   AST 29 03/29/2021   ALKPHOS 84 03/29/2021   BILITOT 0.3  03/29/2021   Lab Results  Component Value Date   HGBA1C 6.8 (H) 03/29/2021   Lab Results  Component Value Date   INSULIN 53.3 (H) 03/29/2021   Lab Results  Component Value Date   TSH 2.220 03/29/2021   Lab Results  Component Value Date   CHOL 163 03/29/2021   HDL 49 03/29/2021   LDLCALC 87 03/29/2021   TRIG 154 (H)  03/29/2021   Lab Results  Component Value Date   VD25OH 38.4 03/29/2021   Lab Results  Component Value Date   WBC 8.0 03/29/2021   HGB 12.6 03/29/2021   HCT 38.0 03/29/2021   MCV 90 03/29/2021   PLT 272 03/29/2021    Attestation Statements:   Reviewed by clinician on day of visit: allergies, medications, problem list, medical history, surgical history, family history, social history, and previous encounter notes.  Coral Ceo, CMA, am acting as transcriptionist for Coralie Common, MD.   I have reviewed the above documentation for accuracy and completeness, and I agree with the above. - Coralie Common, MD

## 2021-09-01 ENCOUNTER — Other Ambulatory Visit (INDEPENDENT_AMBULATORY_CARE_PROVIDER_SITE_OTHER): Payer: Self-pay | Admitting: Family Medicine

## 2021-09-01 DIAGNOSIS — G4733 Obstructive sleep apnea (adult) (pediatric): Secondary | ICD-10-CM | POA: Diagnosis not present

## 2021-09-01 DIAGNOSIS — E1165 Type 2 diabetes mellitus with hyperglycemia: Secondary | ICD-10-CM

## 2021-09-03 DIAGNOSIS — Z114 Encounter for screening for human immunodeficiency virus [HIV]: Secondary | ICD-10-CM | POA: Diagnosis not present

## 2021-09-03 DIAGNOSIS — Z20822 Contact with and (suspected) exposure to covid-19: Secondary | ICD-10-CM | POA: Diagnosis not present

## 2021-09-03 DIAGNOSIS — E559 Vitamin D deficiency, unspecified: Secondary | ICD-10-CM | POA: Diagnosis not present

## 2021-09-03 DIAGNOSIS — Z6831 Body mass index (BMI) 31.0-31.9, adult: Secondary | ICD-10-CM | POA: Diagnosis not present

## 2021-09-03 DIAGNOSIS — Z1339 Encounter for screening examination for other mental health and behavioral disorders: Secondary | ICD-10-CM | POA: Diagnosis not present

## 2021-09-03 DIAGNOSIS — I10 Essential (primary) hypertension: Secondary | ICD-10-CM | POA: Diagnosis not present

## 2021-09-03 DIAGNOSIS — Z711 Person with feared health complaint in whom no diagnosis is made: Secondary | ICD-10-CM | POA: Diagnosis not present

## 2021-09-03 DIAGNOSIS — Z1331 Encounter for screening for depression: Secondary | ICD-10-CM | POA: Diagnosis not present

## 2021-09-03 DIAGNOSIS — E1140 Type 2 diabetes mellitus with diabetic neuropathy, unspecified: Secondary | ICD-10-CM | POA: Diagnosis not present

## 2021-09-03 DIAGNOSIS — R0602 Shortness of breath: Secondary | ICD-10-CM | POA: Diagnosis not present

## 2021-09-03 DIAGNOSIS — E782 Mixed hyperlipidemia: Secondary | ICD-10-CM | POA: Diagnosis not present

## 2021-09-03 DIAGNOSIS — Z0000 Encounter for general adult medical examination without abnormal findings: Secondary | ICD-10-CM | POA: Diagnosis not present

## 2021-09-03 DIAGNOSIS — Z79899 Other long term (current) drug therapy: Secondary | ICD-10-CM | POA: Diagnosis not present

## 2021-09-12 ENCOUNTER — Ambulatory Visit (INDEPENDENT_AMBULATORY_CARE_PROVIDER_SITE_OTHER): Payer: BC Managed Care – PPO | Admitting: Family Medicine

## 2021-09-12 ENCOUNTER — Other Ambulatory Visit: Payer: Self-pay

## 2021-09-12 ENCOUNTER — Encounter (INDEPENDENT_AMBULATORY_CARE_PROVIDER_SITE_OTHER): Payer: Self-pay | Admitting: Family Medicine

## 2021-09-12 VITALS — BP 142/90 | HR 85 | Temp 97.6°F | Ht 62.0 in | Wt 165.0 lb

## 2021-09-12 DIAGNOSIS — E559 Vitamin D deficiency, unspecified: Secondary | ICD-10-CM

## 2021-09-12 DIAGNOSIS — E669 Obesity, unspecified: Secondary | ICD-10-CM

## 2021-09-12 DIAGNOSIS — K760 Fatty (change of) liver, not elsewhere classified: Secondary | ICD-10-CM

## 2021-09-12 DIAGNOSIS — E1169 Type 2 diabetes mellitus with other specified complication: Secondary | ICD-10-CM | POA: Diagnosis not present

## 2021-09-12 DIAGNOSIS — E1165 Type 2 diabetes mellitus with hyperglycemia: Secondary | ICD-10-CM

## 2021-09-12 DIAGNOSIS — E785 Hyperlipidemia, unspecified: Secondary | ICD-10-CM | POA: Diagnosis not present

## 2021-09-12 DIAGNOSIS — Z6830 Body mass index (BMI) 30.0-30.9, adult: Secondary | ICD-10-CM

## 2021-09-12 DIAGNOSIS — Z7985 Long-term (current) use of injectable non-insulin antidiabetic drugs: Secondary | ICD-10-CM

## 2021-09-12 NOTE — Progress Notes (Signed)
Chief Complaint:   OBESITY Sheila Guerra is here to discuss her progress with her obesity treatment plan along with follow-up of her obesity related diagnoses. Sheila Guerra is on the Category 2 Plan + 100 calories and the Pescatarian Plan + 200 calories and states she is following her eating plan approximately 50% of the time. Sheila Guerra states she is not currently exercising.  Today's visit was #: 8 Starting weight: 190 lbs Starting date: 03/29/2021 Today's weight: 165 lbs Today's date: 09/12/2021 Total lbs lost to date: 25 Total lbs lost since last in-office visit: 6  Interim History: Pt was a little worried coming back to clinic. She is trying to stay controlled of food calories and is focusing protein. She has been incorporating some soups and wants to start eating more salads. No plans for travel. Pt may want to start some YouTube exercise videos.   Subjective:   1. Type 2 diabetes mellitus with hyperglycemia, without long-term current use of insulin ([name]) Pt's last A1c was 6.8. She is on Mounjaro.  2. Vitamin D deficiency Pt's last Vit D level was 38.4 and she notes fatigue.  3. NAFLD (nonalcoholic fatty liver disease) Pt's last ALT 38. Her AST and alkaline phosphate  within normal limits.  4. Hyperlipidemia associated with type 2 diabetes mellitus ([place]) Pt has an LDL of 87, HDL 49, and triglycerides 154. She is on Crestor.  Assessment/Plan:   1. Type 2 diabetes mellitus with hyperglycemia, without long-term current use of insulin ([name]) Good blood sugar control is important to decrease the likelihood of diabetic complications such as nephropathy, neuropathy, limb loss, blindness, coronary artery disease, and death. Intensive lifestyle modification including diet, exercise and weight loss are the first line of treatment for diabetes. Check labs today.  - Hemoglobin A1c - Insulin, random  2. Vitamin D deficiency Low Vitamin D level contributes to fatigue and are associated with obesity,  breast, and colon cancer. She agrees to continue to take prescription Vitamin D 50,000 IU every week and will follow-up for routine testing of Vitamin D, at least 2-3 times per year to avoid over-replacement. Check labs today.  - VITAMIN D 25 Hydroxy (Vit-D Deficiency, Fractures)  3. NAFLD (nonalcoholic fatty liver disease) We discussed the likely diagnosis of non-alcoholic fatty liver disease today and how this condition is obesity related. Sheila Guerra was educated the importance of weight loss. Sheila Guerra agreed to continue with her weight loss efforts with healthier diet and exercise as an essential part of her treatment plan. Check labs today.  - Comprehensive metabolic panel  4. Hyperlipidemia associated with type 2 diabetes mellitus ([place]) Cardiovascular risk and specific lipid/LDL goals reviewed.  We discussed several lifestyle modifications today and Sheila Guerra will continue to work on diet, exercise and weight loss efforts. Orders and follow up as documented in patient record.   Counseling Intensive lifestyle modifications are the first line treatment for this issue. Dietary changes: Increase soluble fiber. Decrease simple carbohydrates. Exercise changes: Moderate to vigorous-intensity aerobic activity 150 minutes per week if tolerated. Lipid-lowering medications: see documented in medical record. Check labs today.  - Lipid Panel With LDL/HDL Ratio  5. Obesity with current BMI of 30.3 Sheila Guerra is currently in the action stage of change. As such, her goal is to continue with weight loss efforts. She has agreed to the Category 2 Plan + 100 calories and the Pescatarian Plan + 200 calories.   Exercise goals:  Start 10 minutes 3 times a week.  Behavioral modification strategies: increasing lean protein  intake, meal planning and cooking strategies, keeping healthy foods in the home, and planning for success.  Sheila Guerra has agreed to follow-up with our clinic in 3 weeks. She was informed of the importance of  frequent follow-up visits to maximize her success with intensive lifestyle modifications for her multiple health conditions.   Sheila Guerra was informed we would discuss her lab results at her next visit unless there is a critical issue that needs to be addressed sooner. Sheila Guerra agreed to keep her next visit at the agreed upon time to discuss these results.  Objective:   Blood pressure (!) 142/90, pulse 85, temperature 97.6 F (36.4 C), height 5\' 2"  (1.575 m), weight 165 lb (74.8 kg), last menstrual period 09/14/2017, SpO2 98 %. Body mass index is 30.18 kg/m.  General: Cooperative, alert, well developed, in no acute distress. HEENT: Conjunctivae and lids unremarkable. Cardiovascular: Regular rhythm.  Lungs: Normal work of breathing. Neurologic: No focal deficits.   Lab Results  Component Value Date   CREATININE 0.63 03/29/2021   BUN 9 03/29/2021   NA 137 03/29/2021   K 4.6 03/29/2021   CL 101 03/29/2021   CO2 21 03/29/2021   Lab Results  Component Value Date   ALT 38 (H) 03/29/2021   AST 29 03/29/2021   ALKPHOS 84 03/29/2021   BILITOT 0.3 03/29/2021   Lab Results  Component Value Date   HGBA1C 6.8 (H) 03/29/2021   Lab Results  Component Value Date   INSULIN 53.3 (H) 03/29/2021   Lab Results  Component Value Date   TSH 2.220 03/29/2021   Lab Results  Component Value Date   CHOL 163 03/29/2021   HDL 49 03/29/2021   LDLCALC 87 03/29/2021   TRIG 154 (H) 03/29/2021   Lab Results  Component Value Date   VD25OH 38.4 03/29/2021   Lab Results  Component Value Date   WBC 8.0 03/29/2021   HGB 12.6 03/29/2021   HCT 38.0 03/29/2021   MCV 90 03/29/2021   PLT 272 03/29/2021   Attestation Statements:   Reviewed by clinician on day of visit: allergies, medications, problem list, medical history, surgical history, family history, social history, and previous encounter notes.  Coral Ceo, CMA, am acting as transcriptionist for Coralie Common, MD.   I have reviewed  the above documentation for accuracy and completeness, and I agree with the above. - Coralie Common, MD

## 2021-09-13 LAB — LIPID PANEL WITH LDL/HDL RATIO
Cholesterol, Total: 137 mg/dL (ref 100–199)
HDL: 47 mg/dL (ref >39)
LDL Chol Calc (NIH): 72 mg/dL (ref 0–99)
LDL/HDL Ratio: 1.5 ratio (ref 0.0–3.2)
Triglycerides: 96 mg/dL (ref 0–149)
VLDL Cholesterol Cal: 18 mg/dL (ref 5–40)

## 2021-09-13 LAB — VITAMIN D 25 HYDROXY (VIT D DEFICIENCY, FRACTURES): Vit D, 25-Hydroxy: 58.2 ng/mL (ref 30.0–100.0)

## 2021-09-13 LAB — COMPREHENSIVE METABOLIC PANEL
ALT: 17 IU/L (ref 0–32)
AST: 21 IU/L (ref 0–40)
Albumin/Globulin Ratio: 1.9 (ref 1.2–2.2)
Albumin: 4.9 g/dL — ABNORMAL HIGH (ref 3.8–4.8)
Alkaline Phosphatase: 83 IU/L (ref 44–121)
BUN/Creatinine Ratio: 16 (ref 9–23)
BUN: 12 mg/dL (ref 6–24)
Bilirubin Total: 0.3 mg/dL (ref 0.0–1.2)
CO2: 23 mmol/L (ref 20–29)
Calcium: 9.9 mg/dL (ref 8.7–10.2)
Chloride: 105 mmol/L (ref 96–106)
Creatinine, Ser: 0.74 mg/dL (ref 0.57–1.00)
Globulin, Total: 2.6 g/dL (ref 1.5–4.5)
Glucose: 80 mg/dL (ref 70–99)
Potassium: 4.7 mmol/L (ref 3.5–5.2)
Sodium: 142 mmol/L (ref 134–144)
Total Protein: 7.5 g/dL (ref 6.0–8.5)
eGFR: 103 mL/min/{1.73_m2} (ref >59)

## 2021-09-13 LAB — HEMOGLOBIN A1C
Est. average glucose Bld gHb Est-mCnc: 103 mg/dL
Hgb A1c MFr Bld: 5.2 % (ref 4.8–5.6)

## 2021-09-13 LAB — INSULIN, RANDOM: INSULIN: 11.9 u[IU]/mL (ref 2.6–24.9)

## 2021-10-01 DIAGNOSIS — Z6831 Body mass index (BMI) 31.0-31.9, adult: Secondary | ICD-10-CM | POA: Diagnosis not present

## 2021-10-01 DIAGNOSIS — E1140 Type 2 diabetes mellitus with diabetic neuropathy, unspecified: Secondary | ICD-10-CM | POA: Diagnosis not present

## 2021-10-01 DIAGNOSIS — K760 Fatty (change of) liver, not elsewhere classified: Secondary | ICD-10-CM | POA: Diagnosis not present

## 2021-10-01 DIAGNOSIS — I10 Essential (primary) hypertension: Secondary | ICD-10-CM | POA: Diagnosis not present

## 2021-10-01 DIAGNOSIS — E782 Mixed hyperlipidemia: Secondary | ICD-10-CM | POA: Diagnosis not present

## 2021-10-02 DIAGNOSIS — G4733 Obstructive sleep apnea (adult) (pediatric): Secondary | ICD-10-CM | POA: Diagnosis not present

## 2021-10-03 ENCOUNTER — Ambulatory Visit (INDEPENDENT_AMBULATORY_CARE_PROVIDER_SITE_OTHER): Payer: BC Managed Care – PPO | Admitting: Family Medicine

## 2021-10-03 ENCOUNTER — Encounter (INDEPENDENT_AMBULATORY_CARE_PROVIDER_SITE_OTHER): Payer: Self-pay | Admitting: Family Medicine

## 2021-10-03 ENCOUNTER — Other Ambulatory Visit: Payer: Self-pay

## 2021-10-03 VITALS — BP 135/86 | HR 85 | Temp 97.9°F | Ht 62.0 in | Wt 163.0 lb

## 2021-10-03 DIAGNOSIS — E785 Hyperlipidemia, unspecified: Secondary | ICD-10-CM | POA: Diagnosis not present

## 2021-10-03 DIAGNOSIS — Z7985 Long-term (current) use of injectable non-insulin antidiabetic drugs: Secondary | ICD-10-CM

## 2021-10-03 DIAGNOSIS — E669 Obesity, unspecified: Secondary | ICD-10-CM

## 2021-10-03 DIAGNOSIS — E1165 Type 2 diabetes mellitus with hyperglycemia: Secondary | ICD-10-CM | POA: Diagnosis not present

## 2021-10-03 DIAGNOSIS — E1169 Type 2 diabetes mellitus with other specified complication: Secondary | ICD-10-CM

## 2021-10-03 DIAGNOSIS — Z6829 Body mass index (BMI) 29.0-29.9, adult: Secondary | ICD-10-CM

## 2021-10-03 DIAGNOSIS — Z6834 Body mass index (BMI) 34.0-34.9, adult: Secondary | ICD-10-CM

## 2021-10-03 NOTE — Progress Notes (Signed)
Chief Complaint:   OBESITY Sheila Guerra is here to discuss her progress with her obesity treatment plan along with follow-up of her obesity related diagnoses. Sheila Guerra is on the Category 2 Plan + 100 calories and the [employer] and states she is following her eating plan approximately 50% of the time. [name] states she is using videos 20 minutes 3-4 times per week.  Today's visit was #: 9 Starting weight: 190 lbs Starting date: 03/29/2021 Today's weight: 163 lbs Today's date: 10/03/2021 Total lbs lost to date: 27 Total lbs lost since last in-office visit: 2  Interim History: Food wise, pt is doing soup or sandwich for lunch and vegetable and protein in the afternoon. She has been trying to work in activity 3-4 times a week and trying to keep up consistency of exercising. Sometimes pt adds in starch/carbohydrates like rice and potatoes.  Subjective:   1. Type 2 diabetes mellitus with hyperglycemia, without long-term current use of insulin ([name]) Novis's last A1c was 5.2 with an insulin level of 11.9. She is on Metformin and Mounjaro.  2. Hyperlipidemia associated with type 2 diabetes mellitus ([place]) [name] has an LDL of 72, HDL 47, and triglycerides 69. LFT's within normal limits. She is on Crestor.  Assessment/Plan:   1. Type 2 diabetes mellitus with hyperglycemia, without long-term current use of insulin ([name]) At goal! Good blood sugar control is important to decrease the likelihood of diabetic complications such as nephropathy, neuropathy, limb loss, blindness, coronary artery disease, and death. Intensive lifestyle modification including diet, exercise and weight loss are the first line of treatment for diabetes. Continue current treatment plan.  2. Hyperlipidemia associated with type 2 diabetes mellitus ([place]) Almost at goal. Cardiovascular risk and specific lipid/LDL goals reviewed.  We discussed several lifestyle modifications today and [name] will continue to work on diet, exercise  and weight loss efforts. Orders and follow up as documented in patient record. Continue Crestor.  Counseling Intensive lifestyle modifications are the first line treatment for this issue. Dietary changes: Increase soluble fiber. Decrease simple carbohydrates. Exercise changes: Moderate to vigorous-intensity aerobic activity 150 minutes per week if tolerated. Lipid-lowering medications: see documented in medical record.  3. Obesity with current BMI of 29.9 [place] is currently in the action stage of change. As such, her goal is to continue with weight loss efforts. She has agreed to the Category 2 Plan + 100 calories and the Sheila Guerra.   Exercise goals:  As is  Behavioral modification strategies: increasing lean protein intake, meal planning and cooking strategies, keeping healthy foods in the home, and planning for success.  Sheila Guerra has agreed to follow-up with our clinic in 3-4 weeks. She was informed of the importance of frequent follow-up visits to maximize her success with intensive lifestyle modifications for her multiple health conditions.   Objective:   Blood pressure 135/86, pulse 85, temperature 97.9 F (36.6 C), height 5\' 2"  (1.575 m), weight 163 lb (73.9 kg), last menstrual period 09/14/2017, SpO2 99 %. Body mass index is 29.81 kg/m.  General: Cooperative, alert, well developed, in no acute distress. HEENT: Conjunctivae and lids unremarkable. Cardiovascular: Regular rhythm.  Lungs: Normal work of breathing. Neurologic: No focal deficits.   Lab Results  Component Value Date   CREATININE 0.74 09/12/2021   BUN 12 09/12/2021   NA 142 09/12/2021   K 4.7 09/12/2021   CL 105 09/12/2021   CO2 23 09/12/2021   Lab Results  Component Value Date   ALT 17 09/12/2021  AST 21 09/12/2021   ALKPHOS 83 09/12/2021   BILITOT 0.3 09/12/2021   Lab Results  Component Value Date   HGBA1C 5.2 09/12/2021   HGBA1C 6.8 (H) 03/29/2021   Lab Results  Component Value Date    INSULIN 11.9 09/12/2021   INSULIN 53.3 (H) 03/29/2021   Lab Results  Component Value Date   TSH 2.220 03/29/2021   Lab Results  Component Value Date   CHOL 137 09/12/2021   HDL 47 09/12/2021   LDLCALC 72 09/12/2021   TRIG 96 09/12/2021   Lab Results  Component Value Date   VD25OH 58.2 09/12/2021   VD25OH 38.4 03/29/2021   Lab Results  Component Value Date   WBC 8.0 03/29/2021   HGB 12.6 03/29/2021   HCT 38.0 03/29/2021   MCV 90 03/29/2021   PLT 272 03/29/2021    Attestation Statements:   Reviewed by clinician on day of visit: allergies, medications, problem list, medical history, surgical history, family history, social history, and previous encounter notes.  Coral Ceo, CMA, am acting as transcriptionist for Coralie Common, MD.   I have reviewed the above documentation for accuracy and completeness, and I agree with the above. - Coralie Common, MD

## 2021-10-10 DIAGNOSIS — K5730 Diverticulosis of large intestine without perforation or abscess without bleeding: Secondary | ICD-10-CM | POA: Diagnosis not present

## 2021-10-10 DIAGNOSIS — N1330 Unspecified hydronephrosis: Secondary | ICD-10-CM | POA: Diagnosis not present

## 2021-10-10 DIAGNOSIS — N132 Hydronephrosis with renal and ureteral calculous obstruction: Secondary | ICD-10-CM | POA: Diagnosis not present

## 2021-10-10 DIAGNOSIS — R1084 Generalized abdominal pain: Secondary | ICD-10-CM | POA: Diagnosis not present

## 2021-10-10 DIAGNOSIS — Z853 Personal history of malignant neoplasm of breast: Secondary | ICD-10-CM | POA: Diagnosis not present

## 2021-10-10 DIAGNOSIS — N201 Calculus of ureter: Secondary | ICD-10-CM | POA: Diagnosis not present

## 2021-10-24 ENCOUNTER — Other Ambulatory Visit (INDEPENDENT_AMBULATORY_CARE_PROVIDER_SITE_OTHER): Payer: Self-pay | Admitting: Family Medicine

## 2021-10-24 DIAGNOSIS — E1165 Type 2 diabetes mellitus with hyperglycemia: Secondary | ICD-10-CM

## 2021-10-24 NOTE — Telephone Encounter (Signed)
Dr.Ukleja 

## 2021-10-25 DIAGNOSIS — D171 Benign lipomatous neoplasm of skin and subcutaneous tissue of trunk: Secondary | ICD-10-CM | POA: Diagnosis not present

## 2021-10-25 DIAGNOSIS — L2089 Other atopic dermatitis: Secondary | ICD-10-CM | POA: Diagnosis not present

## 2021-10-26 ENCOUNTER — Other Ambulatory Visit (INDEPENDENT_AMBULATORY_CARE_PROVIDER_SITE_OTHER): Payer: Self-pay | Admitting: Family Medicine

## 2021-10-26 ENCOUNTER — Encounter (INDEPENDENT_AMBULATORY_CARE_PROVIDER_SITE_OTHER): Payer: Self-pay | Admitting: Family Medicine

## 2021-10-26 DIAGNOSIS — E1165 Type 2 diabetes mellitus with hyperglycemia: Secondary | ICD-10-CM

## 2021-10-26 NOTE — Telephone Encounter (Signed)
Dr.Ukleja 

## 2021-10-28 DIAGNOSIS — D171 Benign lipomatous neoplasm of skin and subcutaneous tissue of trunk: Secondary | ICD-10-CM | POA: Diagnosis not present

## 2021-10-31 ENCOUNTER — Encounter (INDEPENDENT_AMBULATORY_CARE_PROVIDER_SITE_OTHER): Payer: Self-pay | Admitting: Family Medicine

## 2021-10-31 ENCOUNTER — Ambulatory Visit (INDEPENDENT_AMBULATORY_CARE_PROVIDER_SITE_OTHER): Payer: BC Managed Care – PPO | Admitting: Family Medicine

## 2021-10-31 ENCOUNTER — Other Ambulatory Visit: Payer: Self-pay

## 2021-10-31 VITALS — BP 138/85 | HR 83 | Temp 98.1°F | Ht 62.0 in | Wt 164.0 lb

## 2021-10-31 DIAGNOSIS — E66811 Obesity, class 1: Secondary | ICD-10-CM

## 2021-10-31 DIAGNOSIS — E785 Hyperlipidemia, unspecified: Secondary | ICD-10-CM

## 2021-10-31 DIAGNOSIS — E669 Obesity, unspecified: Secondary | ICD-10-CM | POA: Diagnosis not present

## 2021-10-31 DIAGNOSIS — E1169 Type 2 diabetes mellitus with other specified complication: Secondary | ICD-10-CM

## 2021-10-31 DIAGNOSIS — E1165 Type 2 diabetes mellitus with hyperglycemia: Secondary | ICD-10-CM

## 2021-10-31 DIAGNOSIS — Z6830 Body mass index (BMI) 30.0-30.9, adult: Secondary | ICD-10-CM

## 2021-10-31 DIAGNOSIS — Z7985 Long-term (current) use of injectable non-insulin antidiabetic drugs: Secondary | ICD-10-CM

## 2021-11-01 NOTE — Progress Notes (Signed)
? ? ? ?Chief Complaint:  ? ?OBESITY ?Sheila Guerra is here to discuss her progress with her obesity treatment plan along with follow-up of her obesity related diagnoses. Sheila Guerra is on the Category 2 Plan + 100 calories or the Sheila Guerra and states she is following her eating plan approximately 70% of the time. Sheila Guerra states she is doing exercise for 10-15 minutes 3-4 times per week. ? ?Today's visit was #: 10 ?Starting weight: 190 lbs ?Starting date: 03/29/2021 ?Today's weight: 164 lbs ?Today's date: 10/31/2021 ?Total lbs lost to date: 73 ?Total lbs lost since last in-office visit: 0 ? ?Interim History: Sheila Guerra started exercising but she had to stop due to kidney stones. She is dealing with currently. She has a history of kidney stones and lithotripsy. She has done well on her meal plan over the last few weeks. She would like to stay on Category 2 and/or [place]. She has lipoma that will be removed next month. ? ?Subjective:  ? ?1. Hyperlipidemia associated with type 2 diabetes mellitus (Sheila Guerra) ?Sheila Guerra is on Crestor with no myalgias or transaminitis. ? ?2. Type 2 diabetes mellitus with hyperglycemia, without long-term current use of insulin (Sheila Guerra) ?Sheila Guerra is on Mounjaro 5 mg SubQ weekly. She denies GI side effects. ? ?Assessment/Plan:  ? ?1. Hyperlipidemia associated with type 2 diabetes mellitus (Sheila Guerra) ?Sheila Guerra will continue Crestor, with no change in medication or dose. We will follow up on labs in July. ? ?2. Type 2 diabetes mellitus with hyperglycemia, without long-term current use of insulin (Sheila Guerra) ?Sheila Guerra will continue Mounjaro, no refill needed. ? ?3. Obesity with current BMI of 30.1 ?Sheila Guerra is currently in the action stage of change. As such, her goal is to continue with weight loss efforts. She has agreed to the Category 2 Plan or the Sheila Guerra.  ? ?Exercise goals: All adults should avoid inactivity. Some physical activity is better than none, and adults who participate in any amount of physical activity gain some  health benefits. ? ?Behavioral modification strategies: increasing lean protein intake, meal planning and cooking strategies, keeping healthy foods in the home, and planning for success. ? ?Sheila Guerra has agreed to follow-up with our clinic in 3 weeks. She was informed of the importance of frequent follow-up visits to maximize her success with intensive lifestyle modifications for her multiple health conditions.  ? ?Objective:  ? ?Blood pressure 138/85, pulse 83, temperature 98.1 ?F (36.7 ?C), height '5\' 2"'$  (1.575 m), weight 164 lb (74.4 kg), last menstrual period 09/14/2017, SpO2 99 %. ?Body mass index is 30 kg/m?. ? ?General: Cooperative, alert, well developed, in no acute distress. ?HEENT: Conjunctivae and lids unremarkable. ?Cardiovascular: Regular rhythm.  ?Lungs: Normal work of breathing. ?Neurologic: No focal deficits.  ? ?Lab Results  ?Component Value Date  ? CREATININE 0.74 09/12/2021  ? BUN 12 09/12/2021  ? NA 142 09/12/2021  ? K 4.7 09/12/2021  ? CL 105 09/12/2021  ? CO2 23 09/12/2021  ? ?Lab Results  ?Component Value Date  ? ALT 17 09/12/2021  ? AST 21 09/12/2021  ? ALKPHOS 83 09/12/2021  ? BILITOT 0.3 09/12/2021  ? ?Lab Results  ?Component Value Date  ? HGBA1C 5.2 09/12/2021  ? HGBA1C 6.8 (H) 03/29/2021  ? ?Lab Results  ?Component Value Date  ? INSULIN 11.9 09/12/2021  ? INSULIN 53.3 (H) 03/29/2021  ? ?Lab Results  ?Component Value Date  ? TSH 2.220 03/29/2021  ? ?Lab Results  ?Component Value Date  ? CHOL 137 09/12/2021  ? HDL 47 09/12/2021  ? [place]  72 09/12/2021  ? TRIG 96 09/12/2021  ? ?Lab Results  ?Component Value Date  ? VD25OH 58.2 09/12/2021  ? VD25OH 38.4 03/29/2021  ? ?Lab Results  ?Component Value Date  ? WBC 8.0 03/29/2021  ? HGB 12.6 03/29/2021  ? HCT 38.0 03/29/2021  ? MCV 90 03/29/2021  ? PLT 272 03/29/2021  ? ?No results found for: IRON, TIBC, FERRITIN ? ?Attestation Statements:  ? ?Reviewed by clinician on day of visit: allergies, medications, problem list, medical history, surgical  history, family history, social history, and previous encounter notes. ? ? ?I, Trixie Dredge, am acting as transcriptionist for Coralie Common, MD. ? ?I have reviewed the above documentation for accuracy and completeness, and I agree with the above. Coralie Common, MD ? ? ?

## 2021-11-08 DIAGNOSIS — N132 Hydronephrosis with renal and ureteral calculous obstruction: Secondary | ICD-10-CM | POA: Diagnosis not present

## 2021-11-16 DIAGNOSIS — D225 Melanocytic nevi of trunk: Secondary | ICD-10-CM | POA: Diagnosis not present

## 2021-11-16 DIAGNOSIS — L918 Other hypertrophic disorders of the skin: Secondary | ICD-10-CM | POA: Diagnosis not present

## 2021-11-16 DIAGNOSIS — L814 Other melanin hyperpigmentation: Secondary | ICD-10-CM | POA: Diagnosis not present

## 2021-11-16 DIAGNOSIS — L821 Other seborrheic keratosis: Secondary | ICD-10-CM | POA: Diagnosis not present

## 2021-11-22 ENCOUNTER — Ambulatory Visit (INDEPENDENT_AMBULATORY_CARE_PROVIDER_SITE_OTHER): Payer: BC Managed Care – PPO | Admitting: Family Medicine

## 2021-11-22 ENCOUNTER — Encounter (INDEPENDENT_AMBULATORY_CARE_PROVIDER_SITE_OTHER): Payer: Self-pay | Admitting: Family Medicine

## 2021-11-22 VITALS — BP 128/79 | HR 84 | Temp 98.3°F | Ht 62.0 in | Wt 163.0 lb

## 2021-11-22 DIAGNOSIS — E1165 Type 2 diabetes mellitus with hyperglycemia: Secondary | ICD-10-CM | POA: Diagnosis not present

## 2021-11-22 DIAGNOSIS — I152 Hypertension secondary to endocrine disorders: Secondary | ICD-10-CM | POA: Diagnosis not present

## 2021-11-22 DIAGNOSIS — E1159 Type 2 diabetes mellitus with other circulatory complications: Secondary | ICD-10-CM | POA: Diagnosis not present

## 2021-11-22 DIAGNOSIS — Z6829 Body mass index (BMI) 29.0-29.9, adult: Secondary | ICD-10-CM

## 2021-11-22 DIAGNOSIS — E669 Obesity, unspecified: Secondary | ICD-10-CM

## 2021-11-22 DIAGNOSIS — Z7985 Long-term (current) use of injectable non-insulin antidiabetic drugs: Secondary | ICD-10-CM

## 2021-11-22 MED ORDER — MOUNJARO 5 MG/0.5ML ~~LOC~~ SOAJ: 5.0000 mg | SUBCUTANEOUS | 0 refills | Status: DC

## 2021-11-23 NOTE — Progress Notes (Signed)
? ? ? ?Chief Complaint:  ? ?OBESITY ?Sheila Guerra is here to discuss her progress with her obesity treatment plan along with follow-up of her obesity related diagnoses. Sheila Guerra is on the Category 2 Plan or the [place] and states she is following her eating plan approximately 75% of the time. Sheila Guerra states she is walking or video for 15-20 minutes 3-4 times per week. ? ?Today's visit was #: 01 ?Starting weight: 190 lbs ?Starting date: 03/29/2021 ?Today's weight: 163 lbs ?Today's date: 11/22/2021 ?Total lbs lost to date: 51 ?Total lbs lost since last in-office visit: 1 ? ?Interim History: Sheila Guerra reports over the last few weeks she has been working in more activity; made goal to incorporate exercise with goal of certain amounts of movement by certain times of the day. Mornings are good in terms of hunger. Snack wise, she is doing string cheese, cucumber, and yogurt. Getting in some meat at supper.  ? ?Subjective:  ? ?1. Type 2 diabetes mellitus with hyperglycemia, without long-term current use of insulin ([place]) ?Sheila Guerra is doing very well on Mounjaro. She has significant A1c improvement from 6.8 to 5.2. She is on metformin as well.  ? ?2. Hypertension associated with diabetes (Sheila Guerra) ?Sheila Guerra is taking Labetolol 100 mg BID. She denies chest pain, chest pressure, or headache.  ? ?Assessment/Plan:  ? ?1. Type 2 diabetes mellitus with hyperglycemia, without long-term current use of insulin (Sheila Guerra) ?Sheila Guerra will continue her medications, and we will refill Mounjaro 5 mg weekly for 1 month. ? ?- tirzepatide (MOUNJARO) 5 MG/0.5ML Pen; Inject 5 mg into the skin once a week.  Dispense: 2 mL; Refill: 0 ? ?2. Hypertension associated with diabetes (Sheila Guerra) ?Sheila Guerra will continue Labetolol, with no change in dosage.  ? ?3. Obesity with current BMI of 29.9 ?Sheila Guerra is currently in the action stage of change. As such, her goal is to continue with weight loss efforts. She has agreed to the Category 2 Plan, keeping a food journal and adhering to  recommended goals of 400-500 calories and 35+ grams of protein at supper daily, and the [place].  ? ?Exercise goals: As is.  ? ?Behavioral modification strategies: increasing lean protein intake, meal planning and cooking strategies, keeping healthy foods in the home, and planning for success. ? ?Sheila Guerra has agreed to follow-up with our clinic in 3 to 4 weeks. She was informed of the importance of frequent follow-up visits to maximize her success with intensive lifestyle modifications for her multiple health conditions.  ? ?Objective:  ? ?Blood pressure 128/79, pulse 84, temperature 98.3 ?F (36.8 ?C), height '5\' 2"'$  (1.575 m), weight 163 lb (73.9 kg), last menstrual period 09/14/2017, SpO2 99 %. ?Body mass index is 29.81 kg/m?. ? ?General: Cooperative, alert, well developed, in no acute distress. ?HEENT: Conjunctivae and lids unremarkable. ?Cardiovascular: Regular rhythm.  ?Lungs: Normal work of breathing. ?Neurologic: No focal deficits.  ? ?Lab Results  ?Component Value Date  ? CREATININE 0.74 09/12/2021  ? BUN 12 09/12/2021  ? NA 142 09/12/2021  ? K 4.7 09/12/2021  ? CL 105 09/12/2021  ? CO2 23 09/12/2021  ? ?Lab Results  ?Component Value Date  ? ALT 17 09/12/2021  ? AST 21 09/12/2021  ? ALKPHOS 83 09/12/2021  ? BILITOT 0.3 09/12/2021  ? ?Lab Results  ?Component Value Date  ? HGBA1C 5.2 09/12/2021  ? HGBA1C 6.8 (H) 03/29/2021  ? ?Lab Results  ?Component Value Date  ? INSULIN 11.9 09/12/2021  ? INSULIN 53.3 (H) 03/29/2021  ? ?Lab Results  ?Component  Value Date  ? TSH 2.220 03/29/2021  ? ?Lab Results  ?Component Value Date  ? CHOL 137 09/12/2021  ? HDL 47 09/12/2021  ? [place] 72 09/12/2021  ? TRIG 96 09/12/2021  ? ?Lab Results  ?Component Value Date  ? VD25OH 58.2 09/12/2021  ? VD25OH 38.4 03/29/2021  ? ?Lab Results  ?Component Value Date  ? WBC 8.0 03/29/2021  ? HGB 12.6 03/29/2021  ? HCT 38.0 03/29/2021  ? MCV 90 03/29/2021  ? PLT 272 03/29/2021  ? ?No results found for: IRON, TIBC, FERRITIN ? ?Attestation  Statements:  ? ?Reviewed by clinician on day of visit: allergies, medications, problem list, medical history, surgical history, family history, social history, and previous encounter notes. ? ? ?I, Trixie Dredge, am acting as transcriptionist for Coralie Common, MD. ? ?I have reviewed the above documentation for accuracy and completeness, and I agree with the above. Coralie Common, MD ? ? ?

## 2021-11-24 DIAGNOSIS — D171 Benign lipomatous neoplasm of skin and subcutaneous tissue of trunk: Secondary | ICD-10-CM | POA: Diagnosis not present

## 2021-11-24 DIAGNOSIS — Z01818 Encounter for other preprocedural examination: Secondary | ICD-10-CM | POA: Diagnosis not present

## 2021-11-24 DIAGNOSIS — I498 Other specified cardiac arrhythmias: Secondary | ICD-10-CM | POA: Diagnosis not present

## 2021-11-24 DIAGNOSIS — Z01810 Encounter for preprocedural cardiovascular examination: Secondary | ICD-10-CM | POA: Diagnosis not present

## 2021-11-24 IMAGING — DX DG ABDOMEN 1V
2 series · 2 of 2 positions shown · non-contrast
Comparison: August 22, 2019

CLINICAL DATA: Left-sided ureteral calculus

EXAM:
ABDOMEN - 1 VIEW

[abdomen kub (1 of 2)]
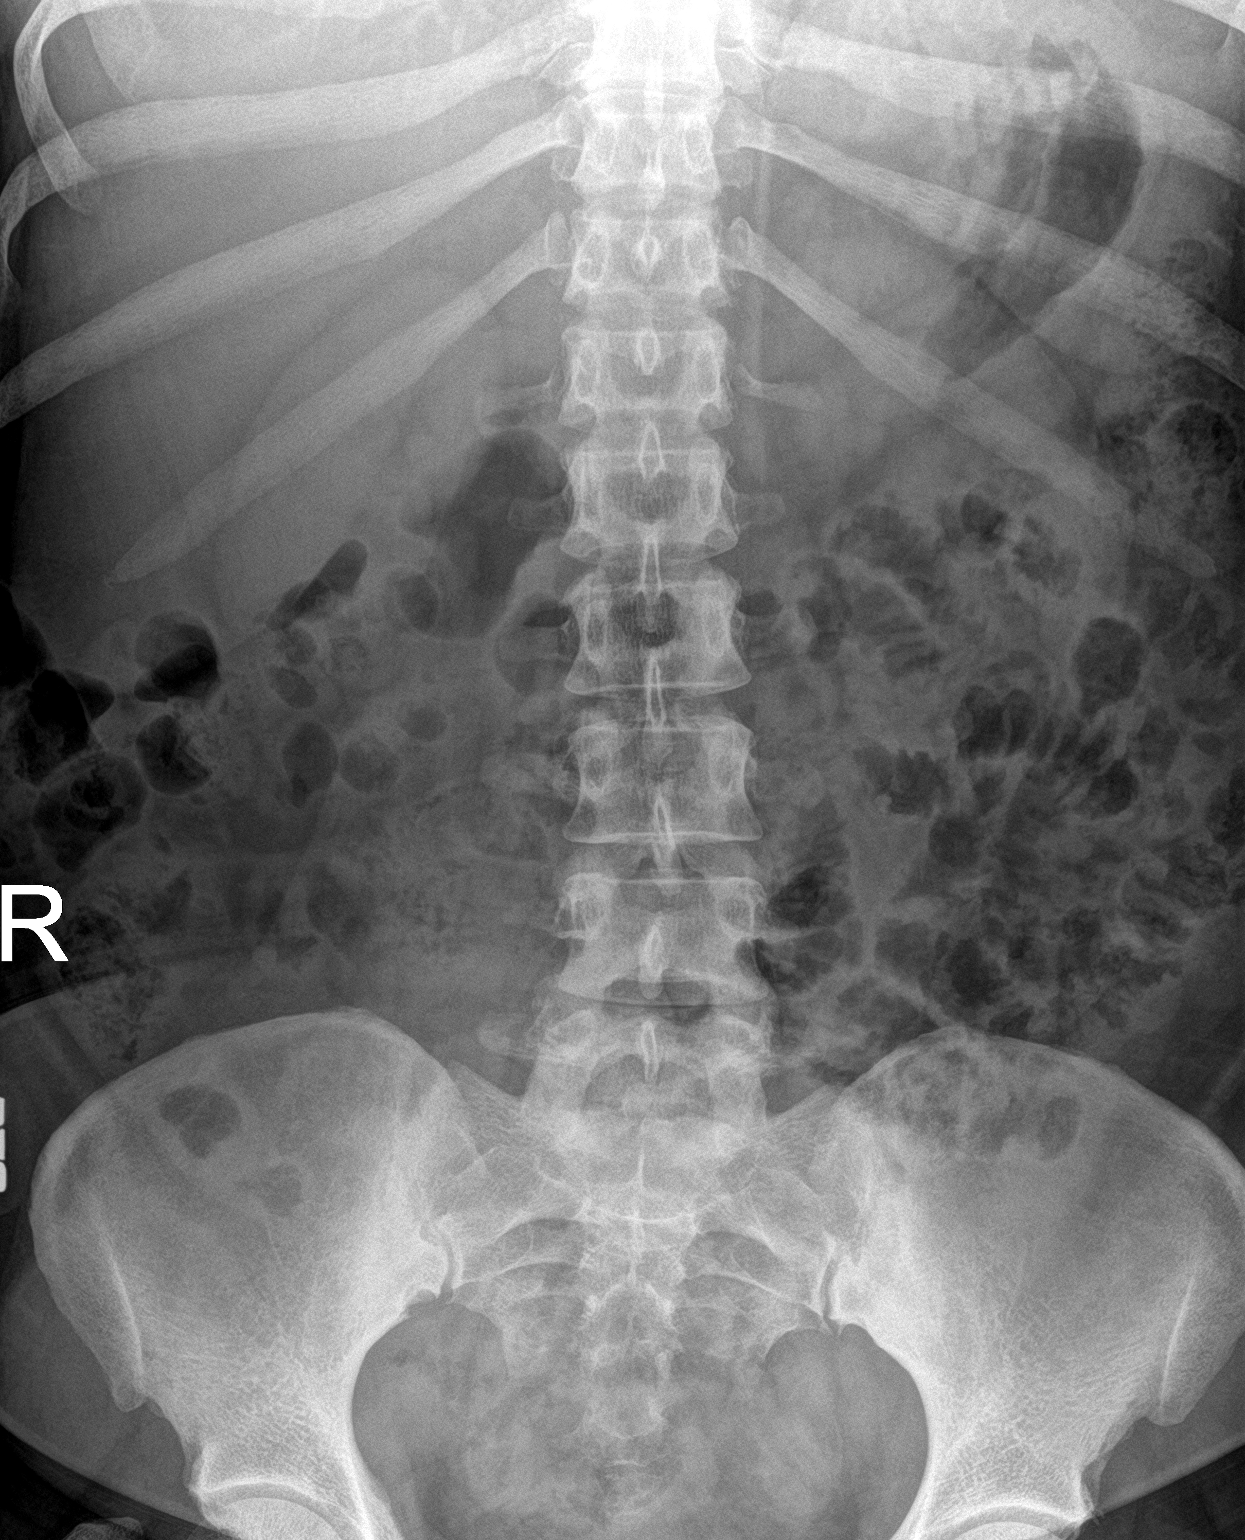

[abdomen kub (2 of 2)]
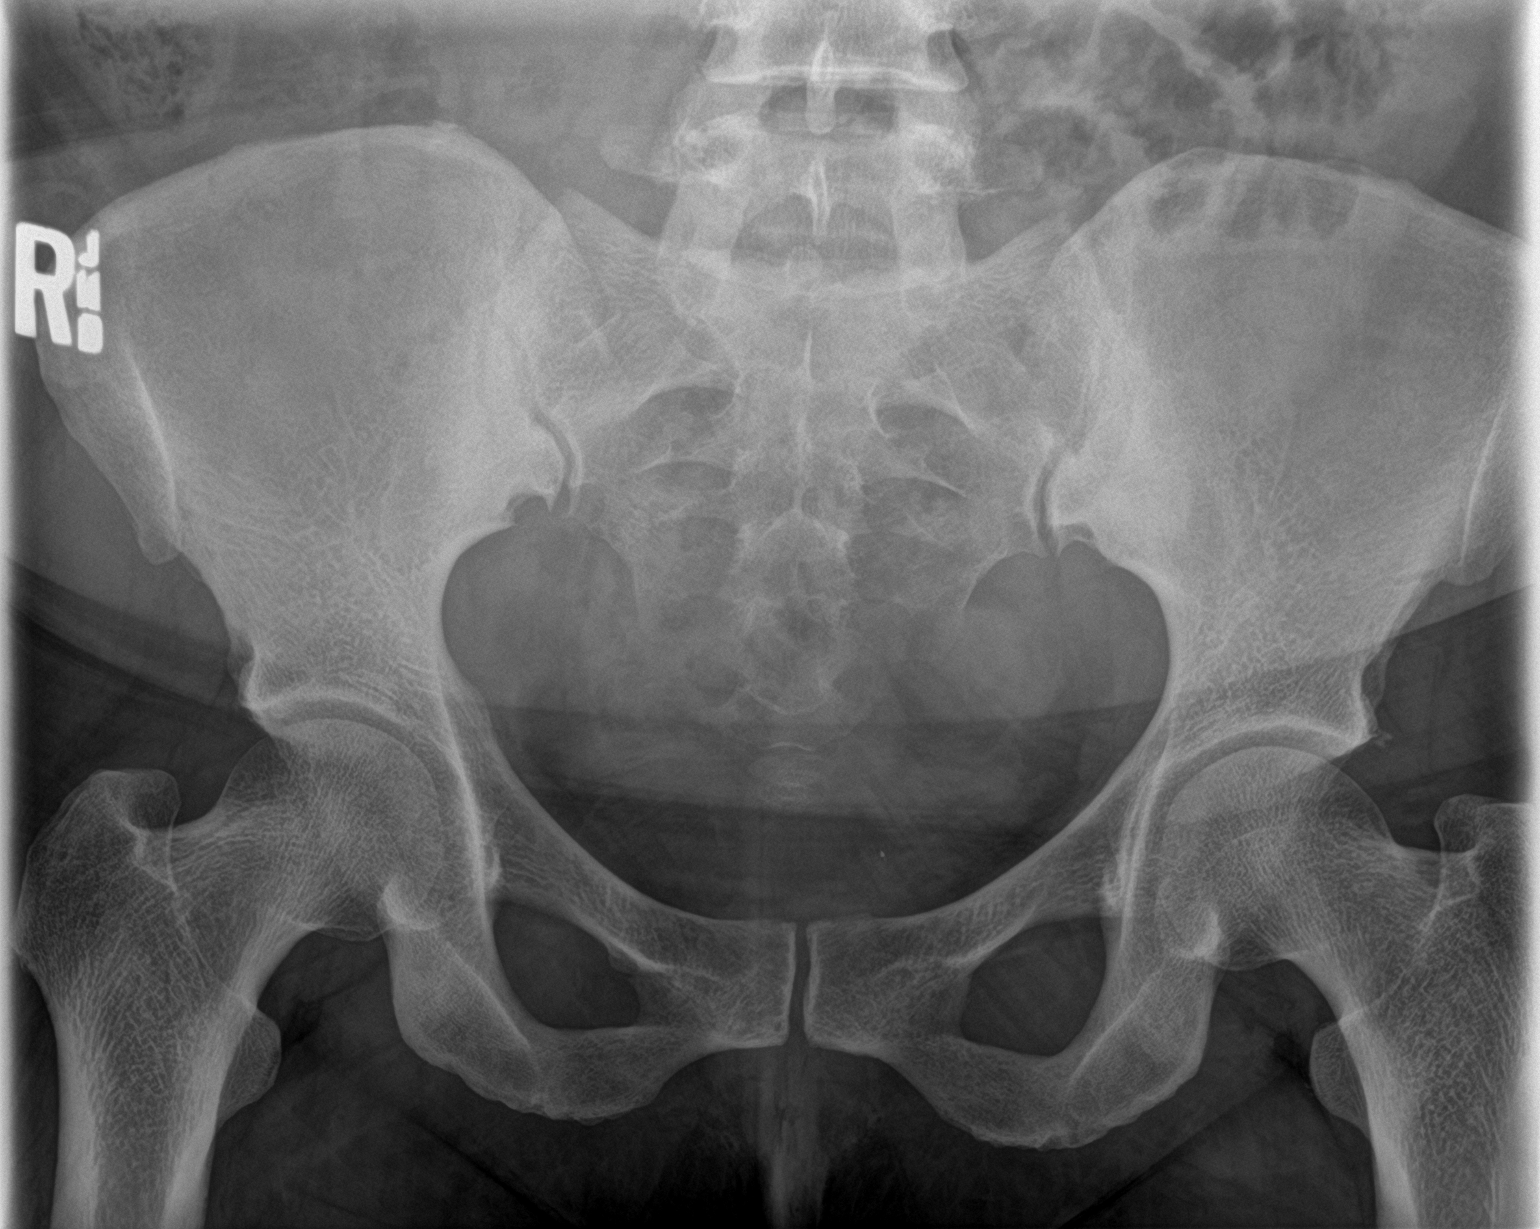

[2 of 2 positions shown; findings below may reference images not displayed]

FINDINGS: There remains a 4 x 3 mm calcification to the left of L3. No new
calcifications evident. There is moderate stool in the colon. There
is no bowel dilatation or air-fluid level to suggest bowel
obstruction. No free air.
IMPRESSION: Presumed left ureteral calculus measuring 4 x 3 mm at the L3 level
on the left. Appearance stable compared to 3 days prior. No bowel
obstruction or free air evident.

## 2021-11-28 DIAGNOSIS — R222 Localized swelling, mass and lump, trunk: Secondary | ICD-10-CM | POA: Diagnosis not present

## 2021-11-28 DIAGNOSIS — D171 Benign lipomatous neoplasm of skin and subcutaneous tissue of trunk: Secondary | ICD-10-CM | POA: Diagnosis not present

## 2021-11-30 DIAGNOSIS — G4733 Obstructive sleep apnea (adult) (pediatric): Secondary | ICD-10-CM | POA: Diagnosis not present

## 2021-12-13 ENCOUNTER — Encounter (INDEPENDENT_AMBULATORY_CARE_PROVIDER_SITE_OTHER): Payer: Self-pay | Admitting: Family Medicine

## 2021-12-13 ENCOUNTER — Ambulatory Visit (INDEPENDENT_AMBULATORY_CARE_PROVIDER_SITE_OTHER): Payer: BC Managed Care – PPO | Admitting: Family Medicine

## 2021-12-13 VITALS — BP 144/88 | HR 75 | Temp 97.8°F | Ht 62.0 in | Wt 167.0 lb

## 2021-12-13 DIAGNOSIS — Z9189 Other specified personal risk factors, not elsewhere classified: Secondary | ICD-10-CM

## 2021-12-13 DIAGNOSIS — E1159 Type 2 diabetes mellitus with other circulatory complications: Secondary | ICD-10-CM | POA: Diagnosis not present

## 2021-12-13 DIAGNOSIS — E1165 Type 2 diabetes mellitus with hyperglycemia: Secondary | ICD-10-CM

## 2021-12-13 DIAGNOSIS — I152 Hypertension secondary to endocrine disorders: Secondary | ICD-10-CM | POA: Diagnosis not present

## 2021-12-13 DIAGNOSIS — Z6830 Body mass index (BMI) 30.0-30.9, adult: Secondary | ICD-10-CM

## 2021-12-13 DIAGNOSIS — E669 Obesity, unspecified: Secondary | ICD-10-CM

## 2021-12-13 DIAGNOSIS — Z7985 Long-term (current) use of injectable non-insulin antidiabetic drugs: Secondary | ICD-10-CM

## 2021-12-13 MED ORDER — MOUNJARO 5 MG/0.5ML ~~LOC~~ SOAJ: 5.0000 mg | SUBCUTANEOUS | 0 refills | Status: DC

## 2021-12-24 DIAGNOSIS — Z9189 Other specified personal risk factors, not elsewhere classified: Secondary | ICD-10-CM | POA: Insufficient documentation

## 2021-12-24 DIAGNOSIS — E1165 Type 2 diabetes mellitus with hyperglycemia: Secondary | ICD-10-CM | POA: Insufficient documentation

## 2021-12-24 DIAGNOSIS — E1159 Type 2 diabetes mellitus with other circulatory complications: Secondary | ICD-10-CM | POA: Insufficient documentation

## 2021-12-24 NOTE — Progress Notes (Signed)
Chief Complaint:   OBESITY Sheila Guerra is here to discuss her progress with her obesity treatment plan along with follow-up of her obesity related diagnoses. Sheila Guerra is on the Category 2 Plan and states she is following her eating plan approximately 75% of the time. Sheila Guerra states she is not exercising  0 minutes 0 times per week.  Today's visit was #: 12 Starting weight: 190 lbs Starting date: 03/29/2021 Today's weight:  167 lbs Today's date: 12/13/2021 Total lbs lost to date: 23 lbs Total lbs lost since last in-office visit: 0  Interim History: Since Sheila Guerra's last appointment she has been mindful of food intake and unfortunately has not been able to do much activity.  She has been more mindful, but wants to tighten up control of food choices.  Sheila Guerra thinks she may not be eating enough.  Subjective:   1. Type 2 diabetes mellitus with hyperglycemia, without long-term current use of insulin ([place]) Sheila Guerra has been doing very well on Mounjaro.  Sheila Guerra denies any GI side effects with Mounjaro and Metformin.  Discussed with Sheila Guerra today increase in medication but thinks she would rather tighten onto Category 2, then reassess.   2. Hypertension associated with diabetes ([place]) Sheila Guerra's blood pressure was slightly elevated today.  Sheila Guerra denies any chest pain, chest pressure, or headaches.  Sheila Guerra has been slightly looser on following Category 2.  3. At risk for activity intolerance Sheila Guerra is at risk of exercise intolerance due to obesity and her recent procedure.  Assessment/Plan:   1. Type 2 diabetes mellitus with hyperglycemia, without long-term current use of insulin ([name]) Sheila Guerra has agreed to follow up on food intake at next appointment.  We will refill Mounjaro today 5 mg SQ weekly.  See below.  - tirzepatide [facility]) 5 MG/0.5ML Pen; Inject 5 mg into the skin once a week.  Dispense: 2 mL; Refill: 0  2. Hypertension associated with diabetes ([place]) Sheila Guerra has agreed to follow up with her blood  pressure at next appointment, if elevated at that time, we will discuss a change in her medications.   3. At risk for activity intolerance Sheila Guerra was given approximately 15 minutes of exercise intolerance counseling today. She is 46 y.o. female and has risk factors exercise intolerance including obesity. We discussed intensive lifestyle modifications today with an emphasis on specific weight loss instructions and strategies. Sheila Guerra will slowly increase activity as tolerated.  Repetitive spaced learning was employed today to elicit superior memory formation and behavioral change.   4. Obesity with current BMI of 30.7 Sheila Guerra is currently in the action stage of change. As such, her goal is to continue with weight loss efforts. She has agreed to the Category 2 Plan.   Exercise goals: All adults should avoid inactivity. Some physical activity is better than none, and adults who participate in any amount of physical activity gain some health benefits.  Behavioral modification strategies: increasing lean protein intake, meal planning and cooking strategies, keeping healthy foods in the home, and planning for success.  Sheila Guerra has agreed to follow-up with our clinic in 4 weeks. She was informed of the importance of frequent follow-up visits to maximize her success with intensive lifestyle modifications for her multiple health conditions.   Objective:   Blood pressure (!) 144/88, pulse 75, temperature 97.8 F (36.6 C), height '5\' 2"'$  (1.575 m), weight 167 lb (75.8 kg), last menstrual period 09/14/2017, SpO2 100 %. Body mass index is 30.54 kg/m.  General: Cooperative, alert, well developed, in no acute distress.  HEENT: Conjunctivae and lids unremarkable. Cardiovascular: Regular rhythm.  Lungs: Normal work of breathing. Neurologic: No focal deficits.   Lab Results  Component Value Date   CREATININE 0.74 09/12/2021   BUN 12 09/12/2021   NA 142 09/12/2021   K 4.7 09/12/2021   CL 105 09/12/2021   CO2  23 09/12/2021   Lab Results  Component Value Date   ALT 17 09/12/2021   AST 21 09/12/2021   ALKPHOS 83 09/12/2021   BILITOT 0.3 09/12/2021   Lab Results  Component Value Date   HGBA1C 5.2 09/12/2021   HGBA1C 6.8 (H) 03/29/2021   Lab Results  Component Value Date   INSULIN 11.9 09/12/2021   INSULIN 53.3 (H) 03/29/2021   Lab Results  Component Value Date   TSH 2.220 03/29/2021   Lab Results  Component Value Date   CHOL 137 09/12/2021   HDL 47 09/12/2021   LDLCALC 72 09/12/2021   TRIG 96 09/12/2021   Lab Results  Component Value Date   VD25OH 58.2 09/12/2021   VD25OH 38.4 03/29/2021   Lab Results  Component Value Date   WBC 8.0 03/29/2021   HGB 12.6 03/29/2021   HCT 38.0 03/29/2021   MCV 90 03/29/2021   PLT 272 03/29/2021   No results found for: IRON, TIBC, FERRITIN  Attestation Statements:   Reviewed by clinician on day of visit: allergies, medications, problem list, medical history, surgical history, family history, social history, and previous encounter notes.  I, Alcus Dad, am acting as transcriptionist for Coralie Common, MD.  I have reviewed the above documentation for accuracy and completeness, and I agree with the above. - Coralie Common, MD

## 2021-12-30 DIAGNOSIS — G4733 Obstructive sleep apnea (adult) (pediatric): Secondary | ICD-10-CM | POA: Diagnosis not present

## 2022-01-10 ENCOUNTER — Encounter (INDEPENDENT_AMBULATORY_CARE_PROVIDER_SITE_OTHER): Payer: Self-pay | Admitting: Family Medicine

## 2022-01-10 ENCOUNTER — Ambulatory Visit (INDEPENDENT_AMBULATORY_CARE_PROVIDER_SITE_OTHER): Payer: BC Managed Care – PPO | Admitting: Family Medicine

## 2022-01-10 VITALS — BP 147/86 | HR 74 | Temp 97.6°F | Ht 62.0 in | Wt 161.0 lb

## 2022-01-10 DIAGNOSIS — I152 Hypertension secondary to endocrine disorders: Secondary | ICD-10-CM | POA: Diagnosis not present

## 2022-01-10 DIAGNOSIS — E669 Obesity, unspecified: Secondary | ICD-10-CM | POA: Diagnosis not present

## 2022-01-10 DIAGNOSIS — E1159 Type 2 diabetes mellitus with other circulatory complications: Secondary | ICD-10-CM | POA: Diagnosis not present

## 2022-01-10 DIAGNOSIS — Z7985 Long-term (current) use of injectable non-insulin antidiabetic drugs: Secondary | ICD-10-CM

## 2022-01-10 DIAGNOSIS — E1165 Type 2 diabetes mellitus with hyperglycemia: Secondary | ICD-10-CM | POA: Diagnosis not present

## 2022-01-10 DIAGNOSIS — E66811 Obesity, class 1: Secondary | ICD-10-CM

## 2022-01-10 DIAGNOSIS — Z6829 Body mass index (BMI) 29.0-29.9, adult: Secondary | ICD-10-CM

## 2022-01-10 DIAGNOSIS — Z7984 Long term (current) use of oral hypoglycemic drugs: Secondary | ICD-10-CM

## 2022-01-10 MED ORDER — MOUNJARO 5 MG/0.5ML ~~LOC~~ SOAJ: 5.0000 mg | SUBCUTANEOUS | 0 refills | Status: DC

## 2022-01-15 DIAGNOSIS — Z6829 Body mass index (BMI) 29.0-29.9, adult: Secondary | ICD-10-CM | POA: Diagnosis not present

## 2022-01-15 DIAGNOSIS — Z20822 Contact with and (suspected) exposure to covid-19: Secondary | ICD-10-CM | POA: Diagnosis not present

## 2022-01-15 DIAGNOSIS — E559 Vitamin D deficiency, unspecified: Secondary | ICD-10-CM | POA: Diagnosis not present

## 2022-01-15 DIAGNOSIS — E1140 Type 2 diabetes mellitus with diabetic neuropathy, unspecified: Secondary | ICD-10-CM | POA: Diagnosis not present

## 2022-01-15 DIAGNOSIS — E782 Mixed hyperlipidemia: Secondary | ICD-10-CM | POA: Diagnosis not present

## 2022-01-15 DIAGNOSIS — Z79899 Other long term (current) drug therapy: Secondary | ICD-10-CM | POA: Diagnosis not present

## 2022-01-15 DIAGNOSIS — I10 Essential (primary) hypertension: Secondary | ICD-10-CM | POA: Diagnosis not present

## 2022-01-15 DIAGNOSIS — K760 Fatty (change of) liver, not elsewhere classified: Secondary | ICD-10-CM | POA: Diagnosis not present

## 2022-01-16 NOTE — Progress Notes (Signed)
Chief Complaint:   OBESITY Levita is here to discuss her progress with her obesity treatment plan along with follow-up of her obesity related diagnoses. Shayann is on the Category 2 Plan and states she is following her eating plan approximately 75% of the time. Danaly states she is walking 10-15 minutes 3-4 times per week.  Today's visit was #: 13 Starting weight: 190 lbs Starting date: 08/162022 Today's weight: 161 lbs Today's date: 01/10/2022 Total lbs lost to date: 23 lbs Total lbs lost since last in-office visit: 6 lbs  Interim History: Yaneliz has started walking since last appointment and pulling herself more onto her meal plan.  She has some soreness in her throat, but does have significant allergy symptoms.  [place] for a month next month, so she is planning to spend a month in her home country.   Subjective:   1. Type 2 diabetes mellitus with hyperglycemia, without long-term current use of insulin ([name]) Liesl is on Mounjaro 5 mg and Metformin.  She denies any GI side effects.  2. Hypertension associated with type 2 diabetes mellitus ([place]) Lauraine's blood pressure was slightly elevated today.  She denies any chest pain, chest pressure or headache.   Assessment/Plan:   1. Type 2 diabetes mellitus with hyperglycemia, without long-term current use of insulin ([name]) Refill Mounjaro 5 mg SQ every week, 2 mL, no refills.  - tirzepatide [facility]) 5 MG/0.5ML Pen; Inject 5 mg into the skin once a week.  Dispense: 2 mL; Refill: 0  2. Hypertension associated with type 2 diabetes mellitus ([place]) Zamya has agreed to follow up with her blood pressure at next appointment if still elevated, we will consider an increase in medications.   3. Obesity with current BMI of 29.6 Halo is currently in the action stage of change. As such, her goal is to continue with weight loss efforts. She has agreed to the Category 2 Plan.   Exercise goals: As is.  Adama has agreed to add in 10 minutes of  resistance training 2 times per week.   Behavioral modification strategies: increasing lean protein intake, meal planning and cooking strategies, keeping healthy foods in the home, and planning for success.  Rosenda has agreed to follow-up with our clinic in 4 weeks. She was informed of the importance of frequent follow-up visits to maximize her success with intensive lifestyle modifications for her multiple health conditions.   Objective:   Blood pressure (!) 147/86, pulse 74, temperature 97.6 F (36.4 C), height '5\' 2"'$  (1.575 m), weight 161 lb (73 kg), last menstrual period 09/14/2017, SpO2 100 %. Body mass index is 29.45 kg/m.  General: Cooperative, alert, well developed, in no acute distress. HEENT: Conjunctivae and lids unremarkable. Cardiovascular: Regular rhythm.  Lungs: Normal work of breathing. Neurologic: No focal deficits.   Lab Results  Component Value Date   CREATININE 0.74 09/12/2021   BUN 12 09/12/2021   NA 142 09/12/2021   K 4.7 09/12/2021   CL 105 09/12/2021   CO2 23 09/12/2021   Lab Results  Component Value Date   ALT 17 09/12/2021   AST 21 09/12/2021   ALKPHOS 83 09/12/2021   BILITOT 0.3 09/12/2021   Lab Results  Component Value Date   HGBA1C 5.2 09/12/2021   HGBA1C 6.8 (H) 03/29/2021   Lab Results  Component Value Date   INSULIN 11.9 09/12/2021   INSULIN 53.3 (H) 03/29/2021   Lab Results  Component Value Date   TSH 2.220 03/29/2021   Lab Results  Component Value Date   CHOL 137 09/12/2021   HDL 47 09/12/2021   LDLCALC 72 09/12/2021   TRIG 96 09/12/2021   Lab Results  Component Value Date   VD25OH 58.2 09/12/2021   VD25OH 38.4 03/29/2021   Lab Results  Component Value Date   WBC 8.0 03/29/2021   HGB 12.6 03/29/2021   HCT 38.0 03/29/2021   MCV 90 03/29/2021   PLT 272 03/29/2021   No results found for: IRON, TIBC, FERRITIN   Attestation Statements:   Reviewed by clinician on day of visit: allergies, medications, problem list,  medical history, surgical history, family history, social history, and previous encounter notes.  I, Davy Pique, RMA, am acting as transcriptionist for Coralie Common, MD.  I have reviewed the above documentation for accuracy and completeness, and I agree with the above. - Coralie Common, MD

## 2022-01-17 ENCOUNTER — Encounter (INDEPENDENT_AMBULATORY_CARE_PROVIDER_SITE_OTHER): Payer: Self-pay | Admitting: Family Medicine

## 2022-01-17 ENCOUNTER — Other Ambulatory Visit (INDEPENDENT_AMBULATORY_CARE_PROVIDER_SITE_OTHER): Payer: Self-pay | Admitting: Family Medicine

## 2022-01-17 DIAGNOSIS — E1165 Type 2 diabetes mellitus with hyperglycemia: Secondary | ICD-10-CM

## 2022-01-17 NOTE — Telephone Encounter (Signed)
Prior authorization has been started for Mounjar[employer]. Will notify patient and provider once response is received.  

## 2022-01-18 ENCOUNTER — Encounter (INDEPENDENT_AMBULATORY_CARE_PROVIDER_SITE_OTHER): Payer: Self-pay

## 2022-01-18 ENCOUNTER — Telehealth (INDEPENDENT_AMBULATORY_CARE_PROVIDER_SITE_OTHER): Payer: Self-pay | Admitting: Family Medicine

## 2022-01-18 NOTE — Telephone Encounter (Signed)
Dr. Ukleja - Prior authorization denied for Mounjaro. Patient already uses copay card. Patient sent denial message via mychart.  

## 2022-01-30 DIAGNOSIS — G4733 Obstructive sleep apnea (adult) (pediatric): Secondary | ICD-10-CM | POA: Diagnosis not present

## 2022-02-01 DIAGNOSIS — J018 Other acute sinusitis: Secondary | ICD-10-CM | POA: Diagnosis not present

## 2022-02-01 DIAGNOSIS — E782 Mixed hyperlipidemia: Secondary | ICD-10-CM | POA: Diagnosis not present

## 2022-02-01 DIAGNOSIS — Z6829 Body mass index (BMI) 29.0-29.9, adult: Secondary | ICD-10-CM | POA: Diagnosis not present

## 2022-02-01 DIAGNOSIS — I10 Essential (primary) hypertension: Secondary | ICD-10-CM | POA: Diagnosis not present

## 2022-02-01 DIAGNOSIS — E1140 Type 2 diabetes mellitus with diabetic neuropathy, unspecified: Secondary | ICD-10-CM | POA: Diagnosis not present

## 2022-02-01 DIAGNOSIS — J301 Allergic rhinitis due to pollen: Secondary | ICD-10-CM | POA: Diagnosis not present

## 2022-02-01 DIAGNOSIS — R059 Cough, unspecified: Secondary | ICD-10-CM | POA: Diagnosis not present

## 2022-02-07 ENCOUNTER — Encounter (INDEPENDENT_AMBULATORY_CARE_PROVIDER_SITE_OTHER): Payer: Self-pay | Admitting: Family Medicine

## 2022-02-07 ENCOUNTER — Ambulatory Visit (INDEPENDENT_AMBULATORY_CARE_PROVIDER_SITE_OTHER): Payer: BC Managed Care – PPO | Admitting: Family Medicine

## 2022-02-07 VITALS — BP 128/80 | HR 80 | Temp 97.7°F | Ht 62.0 in | Wt 159.0 lb

## 2022-02-07 DIAGNOSIS — E1169 Type 2 diabetes mellitus with other specified complication: Secondary | ICD-10-CM | POA: Diagnosis not present

## 2022-02-07 DIAGNOSIS — E669 Obesity, unspecified: Secondary | ICD-10-CM

## 2022-02-07 DIAGNOSIS — Z7985 Long-term (current) use of injectable non-insulin antidiabetic drugs: Secondary | ICD-10-CM

## 2022-02-07 DIAGNOSIS — E1165 Type 2 diabetes mellitus with hyperglycemia: Secondary | ICD-10-CM

## 2022-02-07 DIAGNOSIS — E785 Hyperlipidemia, unspecified: Secondary | ICD-10-CM

## 2022-02-07 DIAGNOSIS — Z6828 Body mass index (BMI) 28.0-28.9, adult: Secondary | ICD-10-CM

## 2022-02-07 MED ORDER — MOUNJARO 5 MG/0.5ML ~~LOC~~ SOAJ: 5.0000 mg | SUBCUTANEOUS | 0 refills | Status: DC

## 2022-02-08 NOTE — Progress Notes (Signed)
Chief Complaint:   OBESITY Sheila Guerra is here to discuss her progress with her obesity treatment plan along with follow-up of her obesity related diagnoses. Sheila Guerra is on the Category 2 Plan and the [place] and states she is following her eating plan approximately 75% of the time. Sheila Guerra states she is walking 10-15 minutes 4 times per week.  Today's visit was #: 14 Starting weight: 190 lbs Starting date: 03/29/2021 Today's weight: 158 lbs Today's date: 02/07/2022 Total lbs lost to date: 32 lbs Total lbs lost since last in-office visit: 3  Interim History: Sheila Guerra ids doing well following plan - sometimes hunger in afternoon but getting more used to smaller amounts. Some sugar cravings. She is going out of country for a month. She has intermittently adding in resistance training.  Subjective:   1. Type 2 diabetes mellitus with hyperglycemia, without long-term current use of insulin ([name]) Sheila Guerra is doing well on 5 mg Mounjaro. Still using $25 voucher.  2. Hyperlipidemia associated with type 2 diabetes mellitus ([place]) Sheila Guerra is currently taking Crestor 10 mg daily with no myalgias or transaminitis.   Assessment/Plan:   1. Type 2 diabetes mellitus with hyperglycemia, without long-term current use of insulin ([name]) We will refill Mounjaro 5 mg SubQ once weekly for 1 month with 0 refills. [place]lyn will follow up on labs from Dr. Tamala [facility] at [facility]) 5 MG/0.5ML Pen; Inject 5 mg into the skin once a week.  Dispense: 2 mL; Refill: 0  2. Hyperlipidemia associated with type 2 diabetes mellitus ([place]) Will obtain labs per PCP  3. Obesity with current BMI of 28.9 Sheila Guerra is currently in the action stage of change. As such, her goal is to continue with weight loss efforts. She has agreed to the Category 2 Plan and the [place].   Exercise goals: Sheila Guerra will start resistance training 3 times a week for 15 minutes.  Behavioral modification  strategies: increasing lean protein intake, meal planning and cooking strategies, keeping healthy foods in the home, and planning for success.  Sheila Guerra has agreed to follow-up with our clinic in 4 weeks. She was informed of the importance of frequent follow-up visits to maximize her success with intensive lifestyle modifications for her multiple health conditions.   Objective:   Blood pressure 128/80, pulse 80, temperature 97.7 F (36.5 C), height '5\' 2"'$  (1.575 m), weight 159 lb (72.1 kg), last menstrual period 09/14/2017, SpO2 100 %. Body mass index is 29.08 kg/m.  General: Cooperative, alert, well developed, in no acute distress. HEENT: Conjunctivae and lids unremarkable. Cardiovascular: Regular rhythm.  Lungs: Normal work of breathing. Neurologic: No focal deficits.   Lab Results  Component Value Date   CREATININE 0.74 09/12/2021   BUN 12 09/12/2021   NA 142 09/12/2021   K 4.7 09/12/2021   CL 105 09/12/2021   CO2 23 09/12/2021   Lab Results  Component Value Date   ALT 17 09/12/2021   AST 21 09/12/2021   ALKPHOS 83 09/12/2021   BILITOT 0.3 09/12/2021   Lab Results  Component Value Date   HGBA1C 5.2 09/12/2021   HGBA1C 6.8 (H) 03/29/2021   Lab Results  Component Value Date   INSULIN 11.9 09/12/2021   INSULIN 53.3 (H) 03/29/2021   Lab Results  Component Value Date   TSH 2.220 03/29/2021   Lab Results  Component Value Date   CHOL 137 09/12/2021   HDL 47 09/12/2021   LDLCALC 72 09/12/2021   TRIG 96  09/12/2021   Lab Results  Component Value Date   VD25OH 58.2 09/12/2021   VD25OH 38.4 03/29/2021   Lab Results  Component Value Date   WBC 8.0 03/29/2021   HGB 12.6 03/29/2021   HCT 38.0 03/29/2021   MCV 90 03/29/2021   PLT 272 03/29/2021   No results found for: "IRON", "TIBC", "FERRITIN"  Attestation Statements:   Reviewed by clinician on day of visit: allergies, medications, problem list, medical history, surgical history, family history, social history,  and previous encounter notes.  I, Elnora Morrison, RMA am acting as transcriptionist for Coralie Common, MD.  I have reviewed the above documentation for accuracy and completeness, and I agree with the above. - Coralie Common, MD

## 2022-03-03 DIAGNOSIS — G4733 Obstructive sleep apnea (adult) (pediatric): Secondary | ICD-10-CM | POA: Diagnosis not present

## 2022-03-15 ENCOUNTER — Encounter (INDEPENDENT_AMBULATORY_CARE_PROVIDER_SITE_OTHER): Payer: Self-pay | Admitting: Family Medicine

## 2022-03-15 ENCOUNTER — Ambulatory Visit (INDEPENDENT_AMBULATORY_CARE_PROVIDER_SITE_OTHER): Payer: BC Managed Care – PPO | Admitting: Family Medicine

## 2022-03-15 VITALS — BP 128/80 | HR 74 | Temp 97.7°F | Ht 62.0 in | Wt 157.0 lb

## 2022-03-15 DIAGNOSIS — E669 Obesity, unspecified: Secondary | ICD-10-CM

## 2022-03-15 DIAGNOSIS — Z7985 Long-term (current) use of injectable non-insulin antidiabetic drugs: Secondary | ICD-10-CM

## 2022-03-15 DIAGNOSIS — Z6828 Body mass index (BMI) 28.0-28.9, adult: Secondary | ICD-10-CM | POA: Diagnosis not present

## 2022-03-15 DIAGNOSIS — E1165 Type 2 diabetes mellitus with hyperglycemia: Secondary | ICD-10-CM | POA: Diagnosis not present

## 2022-03-15 DIAGNOSIS — E559 Vitamin D deficiency, unspecified: Secondary | ICD-10-CM

## 2022-03-15 MED ORDER — MOUNJARO 5 MG/0.5ML ~~LOC~~ SOAJ: 5.0000 mg | SUBCUTANEOUS | 0 refills | Status: DC

## 2022-03-20 NOTE — Progress Notes (Signed)
Chief Complaint:   OBESITY Sheila Guerra is here to discuss her progress with her obesity treatment plan along with follow-up of her obesity related diagnoses. Sheila Guerra is on the Category 2 Plan and the [place] and states she is following her eating plan approximately 75% of the time. Sheila Guerra states she is exercising 0 minutes 0 times per week.  Today's visit was #: 15 Starting weight: 190 lbs Starting date: 03/29/2021 Today's weight: 157 lbs Today's date: 03/15/2022 Total lbs lost to date: 33 lbs Total lbs lost since last in-office visit: 1  Interim History: Sheila Guerra has been being in control of food choices over the last month. She returned from her trip out of the country for a month. The next few weeks she has visitors from her country for 2 weeks. She recognizes she has not been able to focus on exercise and does not want to increase adherence to being more active.  Subjective:   1. Type 2 diabetes mellitus with hyperglycemia, without long-term current use of insulin ([name]) Sheila Guerra is currently taking Mounjaro 5 mg. Denies GI side effects noted. Her A1c was 5.2, insulin was 11.9.  2. Vitamin D deficiency Sheila Guerra is currently taking prescription Vit D 50,000 IU once a week but her last level was of 582. Denies any nausea, vomiting or muscle weakness.  Assessment/Plan:   1. Type 2 diabetes mellitus with hyperglycemia, without long-term current use of insulin ([name]) We will refill Mounjaro 5 mg SubQ once a week for 1 month with 0 refills.  -Refill tirzepatide (MOUNJARO) 5 MG/0.5ML Pen; Inject 5 mg into the skin once a week.  Dispense: 2 mL; Refill: 0  2. Vitamin D deficiency Sheila Guerra will continue taking Vit D. Labs at next appointment.  3. Obesity with current BMI of 28.8 Sheila Guerra is currently in the action stage of change. As such, her goal is to continue with weight loss efforts. She has agreed to the Category 2 Plan and the [place].   Exercise goals: All adults should avoid  inactivity. Some physical activity is better than none, and adults who participate in any amount of physical activity gain some health benefits.  Behavioral modification strategies: increasing lean protein intake, meal planning and cooking strategies, and keeping healthy foods in the home.  Sheila Guerra has agreed to follow-up with our clinic in 4 weeks. She was informed of the importance of frequent follow-up visits to maximize her success with intensive lifestyle modifications for her multiple health conditions.   Objective:   Blood pressure 128/80, pulse 74, temperature 97.7 F (36.5 C), height '5\' 2"'$  (1.575 m), weight 157 lb (71.2 kg), last menstrual period 09/14/2017, SpO2 99 %. Body mass index is 28.72 kg/m.  General: Cooperative, alert, well developed, in no acute distress. HEENT: Conjunctivae and lids unremarkable. Cardiovascular: Regular rhythm.  Lungs: Normal work of breathing. Neurologic: No focal deficits.   Lab Results  Component Value Date   CREATININE 0.74 09/12/2021   BUN 12 09/12/2021   NA 142 09/12/2021   K 4.7 09/12/2021   CL 105 09/12/2021   CO2 23 09/12/2021   Lab Results  Component Value Date   ALT 17 09/12/2021   AST 21 09/12/2021   ALKPHOS 83 09/12/2021   BILITOT 0.3 09/12/2021   Lab Results  Component Value Date   HGBA1C 5.2 09/12/2021   HGBA1C 6.8 (H) 03/29/2021   Lab Results  Component Value Date   INSULIN 11.9 09/12/2021   INSULIN 53.3 (H) 03/29/2021   Lab Results  Component Value Date   TSH 2.220 03/29/2021   Lab Results  Component Value Date   CHOL 137 09/12/2021   HDL 47 09/12/2021   LDLCALC 72 09/12/2021   TRIG 96 09/12/2021   Lab Results  Component Value Date   VD25OH 58.2 09/12/2021   VD25OH 38.4 03/29/2021   Lab Results  Component Value Date   WBC 8.0 03/29/2021   HGB 12.6 03/29/2021   HCT 38.0 03/29/2021   MCV 90 03/29/2021   PLT 272 03/29/2021   No results found for: "IRON", "TIBC", "FERRITIN"  Attestation Statements:    Reviewed by clinician on day of visit: allergies, medications, problem list, medical history, surgical history, family history, social history, and previous encounter notes.  I, Elnora Morrison, RMA am acting as transcriptionist for Coralie Common, MD.  I have reviewed the above documentation for accuracy and completeness, and I agree with the above. - Coralie Common, MD

## 2022-03-22 ENCOUNTER — Encounter (INDEPENDENT_AMBULATORY_CARE_PROVIDER_SITE_OTHER): Payer: Self-pay

## 2022-04-03 DIAGNOSIS — G4733 Obstructive sleep apnea (adult) (pediatric): Secondary | ICD-10-CM | POA: Diagnosis not present

## 2022-04-10 ENCOUNTER — Ambulatory Visit (INDEPENDENT_AMBULATORY_CARE_PROVIDER_SITE_OTHER): Payer: BC Managed Care – PPO | Admitting: Family Medicine

## 2022-04-10 ENCOUNTER — Encounter (INDEPENDENT_AMBULATORY_CARE_PROVIDER_SITE_OTHER): Payer: Self-pay | Admitting: Family Medicine

## 2022-04-10 VITALS — BP 130/85 | HR 80 | Temp 97.6°F | Ht 62.0 in | Wt 151.0 lb

## 2022-04-10 DIAGNOSIS — E559 Vitamin D deficiency, unspecified: Secondary | ICD-10-CM | POA: Diagnosis not present

## 2022-04-10 DIAGNOSIS — Z7985 Long-term (current) use of injectable non-insulin antidiabetic drugs: Secondary | ICD-10-CM

## 2022-04-10 DIAGNOSIS — Z6827 Body mass index (BMI) 27.0-27.9, adult: Secondary | ICD-10-CM

## 2022-04-10 DIAGNOSIS — E1165 Type 2 diabetes mellitus with hyperglycemia: Secondary | ICD-10-CM | POA: Diagnosis not present

## 2022-04-10 DIAGNOSIS — E669 Obesity, unspecified: Secondary | ICD-10-CM | POA: Diagnosis not present

## 2022-04-10 MED ORDER — MOUNJARO 5 MG/0.5ML ~~LOC~~ SOAJ: 5.0000 mg | SUBCUTANEOUS | 0 refills | Status: DC

## 2022-04-11 LAB — INSULIN, RANDOM: INSULIN: 11.8 u[IU]/mL (ref 2.6–24.9)

## 2022-04-11 LAB — COMPREHENSIVE METABOLIC PANEL
ALT: 22 IU/L (ref 0–32)
AST: 21 IU/L (ref 0–40)
Albumin/Globulin Ratio: 2 (ref 1.2–2.2)
Albumin: 4.9 g/dL (ref 3.9–4.9)
Alkaline Phosphatase: 69 IU/L (ref 44–121)
BUN/Creatinine Ratio: 20 (ref 9–23)
BUN: 13 mg/dL (ref 6–24)
Bilirubin Total: 0.3 mg/dL (ref 0.0–1.2)
CO2: 22 mmol/L (ref 20–29)
Calcium: 10.2 mg/dL (ref 8.7–10.2)
Chloride: 105 mmol/L (ref 96–106)
Creatinine, Ser: 0.65 mg/dL (ref 0.57–1.00)
Globulin, Total: 2.5 g/dL (ref 1.5–4.5)
Glucose: 73 mg/dL (ref 70–99)
Potassium: 4.6 mmol/L (ref 3.5–5.2)
Sodium: 141 mmol/L (ref 134–144)
Total Protein: 7.4 g/dL (ref 6.0–8.5)
eGFR: 112 mL/min/{1.73_m2} (ref >59)

## 2022-04-11 LAB — LIPID PANEL WITH LDL/HDL RATIO
Cholesterol, Total: 139 mg/dL (ref 100–199)
HDL: 57 mg/dL (ref >39)
LDL Chol Calc (NIH): 64 mg/dL (ref 0–99)
LDL/HDL Ratio: 1.1 ratio (ref 0.0–3.2)
Triglycerides: 94 mg/dL (ref 0–149)
VLDL Cholesterol Cal: 18 mg/dL (ref 5–40)

## 2022-04-11 LAB — HEMOGLOBIN A1C
Est. average glucose Bld gHb Est-mCnc: 91 mg/dL
Hgb A1c MFr Bld: 4.8 % (ref 4.8–5.6)

## 2022-04-11 LAB — VITAMIN D 25 HYDROXY (VIT D DEFICIENCY, FRACTURES): Vit D, 25-Hydroxy: 76.4 ng/mL (ref 30.0–100.0)

## 2022-04-15 NOTE — Progress Notes (Signed)
Chief Complaint:   OBESITY Sheila Guerra is here to discuss her progress with her obesity treatment plan along with follow-up of her obesity related diagnoses. Sheila Guerra is on the Category 2 Plan and the [place] and states she is following her eating plan approximately 75% of the time. Sheila Guerra states she is walking 20 minutes 3 to 4 times per week.  Today's visit was #: 18 Starting weight: 190 lbs Starting date: 03/29/2021 Today's weight: 151 lbs Today's date: 04/10/2022 Total lbs lost to date: 39 Total lbs lost since last in-office visit: 6  Interim History: Sheila Guerra has really followed her meal plan over the last few weeks with the exception of not always getting in all of the food. Sometimes she is not getting in all of her sandwich. Mayeli is eating crackers and peanut butter and or eggs for a snack.  Subjective:   1. Vitamin D deficiency She is currently taking prescription vitamin D 50,000 IU each week. Her last vitamin D level was 58.2. She notes fatigue and denies nausea, vomiting, or muscle weakness.  2. Type 2 diabetes mellitus with hyperglycemia, without long-term current use of insulin ([place]) Sheila Guerra is on prescription Mounjaro '5mg'$ . Her last A1c was 5.2 and insulin was 11.9 in January 2023. She is on a statin as well.  Assessment/Plan:   1. Vitamin D deficiency Low Vitamin D level contributes to fatigue and are associated with obesity, breast, and colon cancer. She agrees to follow-up for routine testing of Vitamin D, at least 2-3 times per year to avoid over-replacement.  - VITAMIN D 25 Hydroxy (Vit-D Deficiency, Fractures)  2. Type 2 diabetes mellitus with hyperglycemia, without long-term current use of insulin ([name]) Sheila Guerra agrees to continue taking Mounjaro '5mg'$  and will follow up as directed.  - Comprehensive metabolic panel - Hemoglobin A1c - Insulin, random - Lipid Panel With LDL/HDL Ratio - tirzepatide (MOUNJARO) 5 MG/0.5ML Pen; Inject 5 mg into the skin once a week.   Dispense: 2 mL; Refill: 0  3. Obesity with current BMI of 27.7 Sheila Guerra is currently in the action stage of change. As such, her goal is to continue with weight loss efforts. She has agreed to the Category 2 Plan and the [place].   Exercise goals:  Add 10 to 15 minutes of resistance training 4 times per week.  Behavioral modification strategies: increasing lean protein intake, meal planning and cooking strategies, keeping healthy foods in the home, and planning for success.  Sheila Guerra has agreed to follow-up with our clinic in 4 weeks. She was informed of the importance of frequent follow-up visits to maximize her success with intensive lifestyle modifications for her multiple health conditions.   Sheila Guerra was informed we would discuss her lab results at her next visit unless there is a critical issue that needs to be addressed sooner. Sheila Guerra agreed to keep her next visit at the agreed upon time to discuss these results.  Objective:   Blood pressure 130/85, pulse 80, temperature 97.6 F (36.4 C), height '5\' 2"'$  (1.575 m), weight 151 lb (68.5 kg), last menstrual period 09/14/2017, SpO2 99 %. Body mass index is 27.62 kg/m.  General: Cooperative, alert, well developed, in no acute distress. HEENT: Conjunctivae and lids unremarkable. Cardiovascular: Regular rhythm.  Lungs: Normal work of breathing. Neurologic: No focal deficits.   Lab Results  Component Value Date   CREATININE 0.65 04/10/2022   BUN 13 04/10/2022   NA 141 04/10/2022   K 4.6 04/10/2022   CL 105 04/10/2022  CO2 22 04/10/2022   Lab Results  Component Value Date   ALT 22 04/10/2022   AST 21 04/10/2022   ALKPHOS 69 04/10/2022   BILITOT 0.3 04/10/2022   Lab Results  Component Value Date   HGBA1C 4.8 04/10/2022   HGBA1C 5.2 09/12/2021   HGBA1C 6.8 (H) 03/29/2021   Lab Results  Component Value Date   INSULIN 11.8 04/10/2022   INSULIN 11.9 09/12/2021   INSULIN 53.3 (H) 03/29/2021   Lab Results  Component Value  Date   TSH 2.220 03/29/2021   Lab Results  Component Value Date   CHOL 139 04/10/2022   HDL 57 04/10/2022   LDLCALC 64 04/10/2022   TRIG 94 04/10/2022   Lab Results  Component Value Date   VD25OH 76.4 04/10/2022   VD25OH 58.2 09/12/2021   VD25OH 38.4 03/29/2021   Lab Results  Component Value Date   WBC 8.0 03/29/2021   HGB 12.6 03/29/2021   HCT 38.0 03/29/2021   MCV 90 03/29/2021   PLT 272 03/29/2021   No results found for: "IRON", "TIBC", "FERRITIN"  Attestation Statements:   Reviewed by clinician on day of visit: allergies, medications, problem list, medical history, surgical history, family history, social history, and previous encounter notes.  IMarcille Blanco, CMA, am acting as transcriptionist for Coralie Common, MD  I have reviewed the above documentation for accuracy and completeness, and I agree with the above. - Coralie Common, MD

## 2022-05-04 DIAGNOSIS — G4733 Obstructive sleep apnea (adult) (pediatric): Secondary | ICD-10-CM | POA: Diagnosis not present

## 2022-05-15 ENCOUNTER — Ambulatory Visit (INDEPENDENT_AMBULATORY_CARE_PROVIDER_SITE_OTHER): Payer: BC Managed Care – PPO | Admitting: Family Medicine

## 2022-05-15 ENCOUNTER — Encounter (INDEPENDENT_AMBULATORY_CARE_PROVIDER_SITE_OTHER): Payer: Self-pay | Admitting: Family Medicine

## 2022-05-15 VITALS — BP 145/85 | HR 73 | Temp 97.8°F | Ht 62.0 in | Wt 150.0 lb

## 2022-05-15 DIAGNOSIS — E1165 Type 2 diabetes mellitus with hyperglycemia: Secondary | ICD-10-CM

## 2022-05-15 DIAGNOSIS — Z6827 Body mass index (BMI) 27.0-27.9, adult: Secondary | ICD-10-CM

## 2022-05-15 DIAGNOSIS — I152 Hypertension secondary to endocrine disorders: Secondary | ICD-10-CM

## 2022-05-15 DIAGNOSIS — E1159 Type 2 diabetes mellitus with other circulatory complications: Secondary | ICD-10-CM

## 2022-05-15 DIAGNOSIS — Z6834 Body mass index (BMI) 34.0-34.9, adult: Secondary | ICD-10-CM

## 2022-05-15 DIAGNOSIS — Z7985 Long-term (current) use of injectable non-insulin antidiabetic drugs: Secondary | ICD-10-CM

## 2022-05-15 DIAGNOSIS — E669 Obesity, unspecified: Secondary | ICD-10-CM | POA: Diagnosis not present

## 2022-05-15 MED ORDER — MOUNJARO 5 MG/0.5ML ~~LOC~~ SOAJ
5.0000 mg | SUBCUTANEOUS | 0 refills | Status: DC
Start: 2022-05-15 — End: 2022-06-15

## 2022-05-16 NOTE — Progress Notes (Signed)
Chief Complaint:   OBESITY Sheila Guerra is here to discuss her progress with her obesity treatment plan along with follow-up of her obesity related diagnoses. Sheila Guerra is on the Category 2 Plan and the [place] and states she is following her eating plan approximately 50% of the time. Sheila Guerra states she is doing strength training 5 minutes 3-4 times per week.  Today's visit was #: 55 Starting weight: 190 lbs Starting date: 03/29/2021 Today's weight: 150 lbs Today's date: 05/15/2022 Total lbs lost to date: 40 lbs Total lbs lost since last in-office visit: 7  Interim History: Last few weeks Sheila Guerra has been trying to stay on plan as possible. She has incorporated some activity as well. Has realized that she has eaten off the plan a bit more then previously. She wants to be more consistent with exercise and quantity. Her daughter's b-day is Oct 28th, she will be 11.   Subjective:   1. Hypertension associated with diabetes ([place]) Sheila Guerra's blood pressure is very slightly elevated today. Previously mostly controlled on Labetalol. Denies chest pain, chest pressure and headache.  2. Type 2 diabetes mellitus with hyperglycemia, without long-term current use of insulin ([name]) Sheila Guerra's last A1c was 4.8!! (Decrease from 6.8 last year). She is on Mounjaro '5mg'$ .  Assessment/Plan:   1. Hypertension associated with diabetes ([place]) Will follow up on blood pressure at next appointment.  2. Type 2 diabetes mellitus with hyperglycemia, without long-term current use of insulin ([name]) We will refill Mounjaro 5 mg once weekly for 1 month with 0 refills.  -Refill tirzepatide (MOUNJARO) 5 MG/0.5ML Pen; Inject 5 mg into the skin once a week.  Dispense: 2 mL; Refill: 0  3. Obesity with current BMI of 27.4 Sheila Guerra is currently in the action stage of change. As such, her goal is to continue with weight loss efforts. She has agreed to the Category 2 Plan and the [place].   Exercise goals: All adults should  avoid inactivity. Some physical activity is better than none, and adults who participate in any amount of physical activity gain some health benefits.  Behavioral modification strategies: increasing lean protein intake, meal planning and cooking strategies, keeping healthy foods in the home, and planning for success.  Sheila Guerra has agreed to follow-up with our clinic in 4 weeks. She was informed of the importance of frequent follow-up visits to maximize her success with intensive lifestyle modifications for her multiple health conditions.   Objective:   Blood pressure (!) 145/85, pulse 73, temperature 97.8 F (36.6 C), height '5\' 2"'$  (1.575 m), weight 150 lb (68 kg), last menstrual period 09/14/2017, SpO2 100 %. Body mass index is 27.44 kg/m.  General: Cooperative, alert, well developed, in no acute distress. HEENT: Conjunctivae and lids unremarkable. Cardiovascular: Regular rhythm.  Lungs: Normal work of breathing. Neurologic: No focal deficits.   Lab Results  Component Value Date   CREATININE 0.65 04/10/2022   BUN 13 04/10/2022   NA 141 04/10/2022   K 4.6 04/10/2022   CL 105 04/10/2022   CO2 22 04/10/2022   Lab Results  Component Value Date   ALT 22 04/10/2022   AST 21 04/10/2022   ALKPHOS 69 04/10/2022   BILITOT 0.3 04/10/2022   Lab Results  Component Value Date   HGBA1C 4.8 04/10/2022   HGBA1C 5.2 09/12/2021   HGBA1C 6.8 (H) 03/29/2021   Lab Results  Component Value Date   INSULIN 11.8 04/10/2022   INSULIN 11.9 09/12/2021   INSULIN 53.3 (H) 03/29/2021  Lab Results  Component Value Date   TSH 2.220 03/29/2021   Lab Results  Component Value Date   CHOL 139 04/10/2022   HDL 57 04/10/2022   LDLCALC 64 04/10/2022   TRIG 94 04/10/2022   Lab Results  Component Value Date   VD25OH 76.4 04/10/2022   VD25OH 58.2 09/12/2021   VD25OH 38.4 03/29/2021   Lab Results  Component Value Date   WBC 8.0 03/29/2021   HGB 12.6 03/29/2021   HCT 38.0 03/29/2021   MCV 90  03/29/2021   PLT 272 03/29/2021   No results found for: "IRON", "TIBC", "FERRITIN"  Attestation Statements:   Reviewed by clinician on day of visit: allergies, medications, problem list, medical history, surgical history, family history, social history, and previous encounter notes.  I, Elnora Morrison, RMA am acting as transcriptionist for Coralie Common, MD.  I have reviewed the above documentation for accuracy and completeness, and I agree with the above. - Coralie Common, MD

## 2022-05-24 DIAGNOSIS — Z853 Personal history of malignant neoplasm of breast: Secondary | ICD-10-CM | POA: Diagnosis not present

## 2022-05-24 DIAGNOSIS — Z08 Encounter for follow-up examination after completed treatment for malignant neoplasm: Secondary | ICD-10-CM | POA: Diagnosis not present

## 2022-05-24 DIAGNOSIS — Z9013 Acquired absence of bilateral breasts and nipples: Secondary | ICD-10-CM | POA: Diagnosis not present

## 2022-06-01 DIAGNOSIS — G4733 Obstructive sleep apnea (adult) (pediatric): Secondary | ICD-10-CM | POA: Diagnosis not present

## 2022-06-03 DIAGNOSIS — Z23 Encounter for immunization: Secondary | ICD-10-CM | POA: Diagnosis not present

## 2022-06-03 DIAGNOSIS — Z79899 Other long term (current) drug therapy: Secondary | ICD-10-CM | POA: Diagnosis not present

## 2022-06-03 DIAGNOSIS — E1140 Type 2 diabetes mellitus with diabetic neuropathy, unspecified: Secondary | ICD-10-CM | POA: Diagnosis not present

## 2022-06-03 DIAGNOSIS — I10 Essential (primary) hypertension: Secondary | ICD-10-CM | POA: Diagnosis not present

## 2022-06-03 DIAGNOSIS — E782 Mixed hyperlipidemia: Secondary | ICD-10-CM | POA: Diagnosis not present

## 2022-06-03 DIAGNOSIS — E559 Vitamin D deficiency, unspecified: Secondary | ICD-10-CM | POA: Diagnosis not present

## 2022-06-03 DIAGNOSIS — K760 Fatty (change of) liver, not elsewhere classified: Secondary | ICD-10-CM | POA: Diagnosis not present

## 2022-06-03 DIAGNOSIS — Z6828 Body mass index (BMI) 28.0-28.9, adult: Secondary | ICD-10-CM | POA: Diagnosis not present

## 2022-06-03 DIAGNOSIS — Z20822 Contact with and (suspected) exposure to covid-19: Secondary | ICD-10-CM | POA: Diagnosis not present

## 2022-06-04 DIAGNOSIS — G4733 Obstructive sleep apnea (adult) (pediatric): Secondary | ICD-10-CM | POA: Diagnosis not present

## 2022-06-07 DIAGNOSIS — Z9882 Breast implant status: Secondary | ICD-10-CM | POA: Diagnosis not present

## 2022-06-07 DIAGNOSIS — D0512 Intraductal carcinoma in situ of left breast: Secondary | ICD-10-CM | POA: Diagnosis not present

## 2022-06-08 ENCOUNTER — Other Ambulatory Visit (INDEPENDENT_AMBULATORY_CARE_PROVIDER_SITE_OTHER): Payer: Self-pay | Admitting: Family Medicine

## 2022-06-08 DIAGNOSIS — E1165 Type 2 diabetes mellitus with hyperglycemia: Secondary | ICD-10-CM

## 2022-06-15 ENCOUNTER — Encounter (INDEPENDENT_AMBULATORY_CARE_PROVIDER_SITE_OTHER): Payer: Self-pay | Admitting: Family Medicine

## 2022-06-15 ENCOUNTER — Ambulatory Visit (INDEPENDENT_AMBULATORY_CARE_PROVIDER_SITE_OTHER): Payer: BC Managed Care – PPO | Admitting: Family Medicine

## 2022-06-15 VITALS — BP 160/88 | HR 76 | Temp 97.8°F | Ht 62.0 in | Wt 153.0 lb

## 2022-06-15 DIAGNOSIS — E1165 Type 2 diabetes mellitus with hyperglycemia: Secondary | ICD-10-CM

## 2022-06-15 DIAGNOSIS — E669 Obesity, unspecified: Secondary | ICD-10-CM

## 2022-06-15 DIAGNOSIS — E785 Hyperlipidemia, unspecified: Secondary | ICD-10-CM

## 2022-06-15 DIAGNOSIS — E1169 Type 2 diabetes mellitus with other specified complication: Secondary | ICD-10-CM | POA: Diagnosis not present

## 2022-06-15 DIAGNOSIS — Z6828 Body mass index (BMI) 28.0-28.9, adult: Secondary | ICD-10-CM

## 2022-06-15 DIAGNOSIS — Z7985 Long-term (current) use of injectable non-insulin antidiabetic drugs: Secondary | ICD-10-CM

## 2022-06-15 MED ORDER — MOUNJARO 5 MG/0.5ML ~~LOC~~ SOAJ: 5.0000 mg | SUBCUTANEOUS | 0 refills | Status: DC

## 2022-06-17 DIAGNOSIS — K760 Fatty (change of) liver, not elsewhere classified: Secondary | ICD-10-CM | POA: Diagnosis not present

## 2022-06-26 NOTE — Progress Notes (Signed)
Chief Complaint:   OBESITY Sheila Guerra is here to discuss her progress with her obesity treatment plan along with follow-up of her obesity related diagnoses. Sheila Guerra is on the Category 2 Plan and the [place] and states she is following her eating plan approximately 75 to 80% of the time. Sheila Guerra states she is using resistance bands for 5 to 10 minutes 3 to 4 times per week.  Today's visit was #: 46 Starting weight: 190 lbs Starting date: 03/29/2021 Today's weight: 153 lbs Today's date: 06/15/2022 Total lbs lost to date: 37 lbs Total lbs lost since last in-office visit: 0  Interim History: Sheila Guerra has been trying to stay on track food wise over the last few weeks. Trying to add 3-4 times a week of resistance training. Wants to get back to beginning. Noticing she is eating more rice recently and wants to decrease this. Going to [place] for Thanksgiving.  Subjective:   1. Type 2 diabetes mellitus with hyperglycemia, without long-term current use of insulin ([name]) Sheila Guerra is on Mounjaro. Denies GI side effects. Last A1c was 4.8 and Insulin was 11.8.  2. Hyperlipidemia associated with type 2 diabetes mellitus ([place]) Sheila Guerra is on Rosuvastatin with no myalgias or transaminitis.  Assessment/Plan:   1. Type 2 diabetes mellitus with hyperglycemia, without long-term current use of insulin ([name]) We will refill Mounjaro 5 mg SubQ once a week for 1 month with 0 refills.  -Refill tirzepatide (MOUNJARO) 5 MG/0.5ML Pen; Inject 5 mg into the skin once a week.  Dispense: 6 mL; Refill: 0  2. Hyperlipidemia associated with type 2 diabetes mellitus ([name]) Continue statin. Will repeat labs in January or February 2024.  3. Obesity with current BMI of 28.1 Sheila Guerra is currently in the action stage of change. As such, her goal is to continue with weight loss efforts. She has agreed to the Category 2 Plan and the [place].   Exercise goals: As is.  Behavioral modification strategies: increasing lean  protein intake, meal planning and cooking strategies, keeping healthy foods in the home, and planning for success.  Sheila Guerra has agreed to follow-up with our clinic in 5 weeks. She was informed of the importance of frequent follow-up visits to maximize her success with intensive lifestyle modifications for her multiple health conditions.   Objective:   Blood pressure (!) 160/88, pulse 76, temperature 97.8 F (36.6 C), height '5\' 2"'$  (1.575 m), weight 153 lb (69.4 kg), last menstrual period 09/14/2017, SpO2 100 %. Body mass index is 27.98 kg/m.  General: Cooperative, alert, well developed, in no acute distress. HEENT: Conjunctivae and lids unremarkable. Cardiovascular: Regular rhythm.  Lungs: Normal work of breathing. Neurologic: No focal deficits.   Lab Results  Component Value Date   CREATININE 0.65 04/10/2022   BUN 13 04/10/2022   NA 141 04/10/2022   K 4.6 04/10/2022   CL 105 04/10/2022   CO2 22 04/10/2022   Lab Results  Component Value Date   ALT 22 04/10/2022   AST 21 04/10/2022   ALKPHOS 69 04/10/2022   BILITOT 0.3 04/10/2022   Lab Results  Component Value Date   HGBA1C 4.8 04/10/2022   HGBA1C 5.2 09/12/2021   HGBA1C 6.8 (H) 03/29/2021   Lab Results  Component Value Date   INSULIN 11.8 04/10/2022   INSULIN 11.9 09/12/2021   INSULIN 53.3 (H) 03/29/2021   Lab Results  Component Value Date   TSH 2.220 03/29/2021   Lab Results  Component Value Date   CHOL 139 04/10/2022  HDL 57 04/10/2022   LDLCALC 64 04/10/2022   TRIG 94 04/10/2022   Lab Results  Component Value Date   VD25OH 76.4 04/10/2022   VD25OH 58.2 09/12/2021   VD25OH 38.4 03/29/2021   Lab Results  Component Value Date   WBC 8.0 03/29/2021   HGB 12.6 03/29/2021   HCT 38.0 03/29/2021   MCV 90 03/29/2021   PLT 272 03/29/2021   No results found for: "IRON", "TIBC", "FERRITIN"  Attestation Statements:   Reviewed by clinician on day of visit: allergies, medications, problem list, medical  history, surgical history, family history, social history, and previous encounter notes.  I, Elnora Morrison, RMA am acting as transcriptionist for Coralie Common, MD. I have reviewed the above documentation for accuracy and completeness, and I agree with the above. - Coralie Common, MD

## 2022-07-02 DIAGNOSIS — G4733 Obstructive sleep apnea (adult) (pediatric): Secondary | ICD-10-CM | POA: Diagnosis not present

## 2022-07-20 ENCOUNTER — Ambulatory Visit (INDEPENDENT_AMBULATORY_CARE_PROVIDER_SITE_OTHER): Payer: BC Managed Care – PPO | Admitting: Family Medicine

## 2022-07-20 ENCOUNTER — Encounter (INDEPENDENT_AMBULATORY_CARE_PROVIDER_SITE_OTHER): Payer: Self-pay | Admitting: Family Medicine

## 2022-07-20 VITALS — BP 161/94 | Temp 97.6°F | Ht 62.0 in | Wt 153.0 lb

## 2022-07-20 DIAGNOSIS — E1165 Type 2 diabetes mellitus with hyperglycemia: Secondary | ICD-10-CM | POA: Diagnosis not present

## 2022-07-20 DIAGNOSIS — R030 Elevated blood-pressure reading, without diagnosis of hypertension: Secondary | ICD-10-CM | POA: Diagnosis not present

## 2022-07-20 DIAGNOSIS — E669 Obesity, unspecified: Secondary | ICD-10-CM | POA: Diagnosis not present

## 2022-07-20 DIAGNOSIS — Z6828 Body mass index (BMI) 28.0-28.9, adult: Secondary | ICD-10-CM | POA: Diagnosis not present

## 2022-07-20 DIAGNOSIS — Z7985 Long-term (current) use of injectable non-insulin antidiabetic drugs: Secondary | ICD-10-CM

## 2022-07-24 DIAGNOSIS — R030 Elevated blood-pressure reading, without diagnosis of hypertension: Secondary | ICD-10-CM | POA: Insufficient documentation

## 2022-07-27 DIAGNOSIS — K760 Fatty (change of) liver, not elsewhere classified: Secondary | ICD-10-CM | POA: Diagnosis not present

## 2022-07-27 NOTE — Progress Notes (Signed)
Chief Complaint:   OBESITY Sheila Guerra is here to discuss her progress with her obesity treatment plan along with follow-up of her obesity related diagnoses. Sheila Guerra is on the Category 2 Plan and states she is following her eating plan approximately 80% of the time. Sheila Guerra states she is not exercising.  Today's visit was #: 75 Starting weight: 190 LBS Starting date: 03/29/2021 Today's weight: 153 LBS Today's date: 07/20/2022 Total lbs lost to date: 26 LBS Total lbs lost since last in-office visit: 0  Interim History: Patient traveled for Thanksgiving.  She has been watching the intake of her sweets, practicing portion control.  Time has been a barrier to exercise.  She is happy with her current weight.  Subjective:   1. Type 2 diabetes mellitus with hyperglycemia, without long-term current use of insulin ([name]) Patient is doing well on Mounjaro 5 mg weekly without adverse side effects.  She has cut back on metformin 500 mg once a day.  Last A1c was 4.8 on 04/10/2022.  2. Elevated BP without diagnosis of hypertension Patient is in pain from recent root canal.  She is taking labetalol for hypertension.  Assessment/Plan:   1. Type 2 diabetes mellitus with hyperglycemia, without long-term current use of insulin ([name]) Continue Mounjaro 5 mg weekly.  Continue metformin 500 mg daily with food.  2. Elevated BP without diagnosis of hypertension Will follow-up with blood pressure next office visit.  3. Obesity,current BMI 28.1 Try out short mom sanity workouts from home 2 times per week, adding and walking on days off of work.  Sheila Guerra is currently in the action stage of change. As such, her goal is to continue with weight loss efforts. She has agreed to the Category 2 Plan.   Exercise goals: All adults should avoid inactivity. Some physical activity is better than none, and adults who participate in any amount of physical activity gain some health benefits.  Behavioral modification strategies:  increasing lean protein intake, increasing vegetables, increasing water intake, meal planning and cooking strategies, keeping healthy foods in the home, holiday eating strategies , and planning for success.  Sheila Guerra has agreed to follow-up with our clinic in 4-5 weeks. She was informed of the importance of frequent follow-up visits to maximize her success with intensive lifestyle modifications for her multiple health conditions.   Objective:   Blood pressure (!) 161/94, temperature 97.6 F (36.4 C), height '5\' 2"'$  (1.575 m), weight 153 lb (69.4 kg), last menstrual period 09/14/2017, SpO2 99 %. Body mass index is 27.98 kg/m.  General: Cooperative, alert, well developed, in no acute distress. HEENT: Conjunctivae and lids unremarkable. Cardiovascular: Regular rhythm.  Lungs: Normal work of breathing. Neurologic: No focal deficits.   Lab Results  Component Value Date   CREATININE 0.65 04/10/2022   BUN 13 04/10/2022   NA 141 04/10/2022   K 4.6 04/10/2022   CL 105 04/10/2022   CO2 22 04/10/2022   Lab Results  Component Value Date   ALT 22 04/10/2022   AST 21 04/10/2022   ALKPHOS 69 04/10/2022   BILITOT 0.3 04/10/2022   Lab Results  Component Value Date   HGBA1C 4.8 04/10/2022   HGBA1C 5.2 09/12/2021   HGBA1C 6.8 (H) 03/29/2021   Lab Results  Component Value Date   INSULIN 11.8 04/10/2022   INSULIN 11.9 09/12/2021   INSULIN 53.3 (H) 03/29/2021   Lab Results  Component Value Date   TSH 2.220 03/29/2021   Lab Results  Component Value Date   CHOL  139 04/10/2022   HDL 57 04/10/2022   LDLCALC 64 04/10/2022   TRIG 94 04/10/2022   Lab Results  Component Value Date   VD25OH 76.4 04/10/2022   VD25OH 58.2 09/12/2021   VD25OH 38.4 03/29/2021   Lab Results  Component Value Date   WBC 8.0 03/29/2021   HGB 12.6 03/29/2021   HCT 38.0 03/29/2021   MCV 90 03/29/2021   PLT 272 03/29/2021   No results found for: "IRON", "TIBC", "FERRITIN"  Attestation Statements:    Reviewed by clinician on day of visit: allergies, medications, problem list, medical history, surgical history, family history, social history, and previous encounter notes.  I, Davy Pique, am acting as Location manager for Loyal Gambler, DO.  I have reviewed the above documentation for accuracy and completeness, and I agree with the above. Dell Ponto, DO

## 2022-08-01 DIAGNOSIS — G4733 Obstructive sleep apnea (adult) (pediatric): Secondary | ICD-10-CM | POA: Diagnosis not present

## 2022-08-22 DIAGNOSIS — Z6835 Body mass index (BMI) 35.0-35.9, adult: Secondary | ICD-10-CM | POA: Diagnosis not present

## 2022-08-22 DIAGNOSIS — G4733 Obstructive sleep apnea (adult) (pediatric): Secondary | ICD-10-CM | POA: Diagnosis not present

## 2022-08-28 ENCOUNTER — Ambulatory Visit (INDEPENDENT_AMBULATORY_CARE_PROVIDER_SITE_OTHER): Payer: BC Managed Care – PPO | Admitting: Internal Medicine

## 2022-08-28 ENCOUNTER — Encounter (INDEPENDENT_AMBULATORY_CARE_PROVIDER_SITE_OTHER): Payer: Self-pay | Admitting: Internal Medicine

## 2022-08-28 VITALS — BP 140/82 | HR 81 | Temp 98.0°F | Ht 62.0 in | Wt 156.0 lb

## 2022-08-28 DIAGNOSIS — E669 Obesity, unspecified: Secondary | ICD-10-CM

## 2022-08-28 DIAGNOSIS — I152 Hypertension secondary to endocrine disorders: Secondary | ICD-10-CM

## 2022-08-28 DIAGNOSIS — E1159 Type 2 diabetes mellitus with other circulatory complications: Secondary | ICD-10-CM | POA: Diagnosis not present

## 2022-08-28 DIAGNOSIS — E1165 Type 2 diabetes mellitus with hyperglycemia: Secondary | ICD-10-CM

## 2022-08-28 DIAGNOSIS — Z6828 Body mass index (BMI) 28.0-28.9, adult: Secondary | ICD-10-CM

## 2022-08-28 MED ORDER — MOUNJARO 5 MG/0.5ML ~~LOC~~ SOAJ: 5.0000 mg | SUBCUTANEOUS | 0 refills | Status: DC

## 2022-08-28 NOTE — Progress Notes (Signed)
Office: [phone]  /  Fax: [phone]  HPI  Chief Complaint: OBESITY  Sheila Guerra is here to discuss her progress with her obesity treatment plan. She is on the the Category 2 Plan and states she is following her eating plan approximately 75% of the time. She states she is exercising 0 minutes 0 times per week.  Today's visit was # 81 Starting weight: 190 Starting date: 03-29-2021 Today's weight : 156 Today's date: 08/28/2022 Total lbs lost to date: 34 Total lbs lost since last in-office visit: +3  Interval History: This is my first encounter with Sheila Guerra.  Since last office visit she has gained 3 pounds reports good adherence to prescribed reduced calorie nutritional plan. Physical activity is described as none minutes a week. Reports problems with cravings for carbs in the afternoon.  This is associated with increased levels of stress.  Denies problems with satiety and satiation. Denies problems with eating patterns and portion control. Sleeping approximately 6 hours a day. Sleep described as restorative. Stress levels are reported as medium and due to Family. Barriers identified lack of time for self-care.  Pharmacotherapy: Tirzepatide Denies adverse effects to medication. Notes improvement  in hunger signals and satiety.  PHYSICAL EXAM:  Blood pressure (!) 140/82, pulse 81, temperature 98 F (36.7 C), height '5\' 2"'$  (1.575 m), weight 156 lb (70.8 kg), last menstrual period 09/14/2017, SpO2 99 %. Body mass index is 28.53 kg/m.  General: She is overweight, cooperative, alert, well developed, and in no acute distress. PSYCH: Has normal mood, affect and thought process.   HEENT: EOMI, sclerae are anicteric. Lungs: Normal breathing effort, no conversational dyspnea. Extremities: No edema.  Neurologic: No gross sensory or motor deficits. No tremors or fasciculations noted.    DIAGNOSTIC DATA REVIEWED:  BMET    Component Value Date/Time   NA 141 04/10/2022 0949   K 4.6  04/10/2022 0949   CL 105 04/10/2022 0949   CO2 22 04/10/2022 0949   GLUCOSE 73 04/10/2022 0949   BUN 13 04/10/2022 0949   CREATININE 0.65 04/10/2022 0949   CALCIUM 10.2 04/10/2022 0949   Lab Results  Component Value Date   HGBA1C 4.8 04/10/2022   HGBA1C 6.8 (H) 03/29/2021   Lab Results  Component Value Date   INSULIN 11.8 04/10/2022   INSULIN 53.3 (H) 03/29/2021   CBC    Component Value Date/Time   WBC 8.0 03/29/2021 0837   RBC 4.24 03/29/2021 0837   HGB 12.6 03/29/2021 0837   HCT 38.0 03/29/2021 0837   PLT 272 03/29/2021 0837   MCV 90 03/29/2021 0837   MCH 29.7 03/29/2021 0837   MCHC 33.2 03/29/2021 0837   RDW 12.7 03/29/2021 0837   Iron/TIBC/Ferritin/ %Sat No results found for: "IRON", "TIBC", "FERRITIN", "IRONPCTSAT" Lipid Panel     Component Value Date/Time   CHOL 139 04/10/2022 0949   TRIG 94 04/10/2022 0949   HDL 57 04/10/2022 0949   LDLCALC 64 04/10/2022 0949   Hepatic Function Panel     Component Value Date/Time   PROT 7.4 04/10/2022 0949   ALBUMIN 4.9 04/10/2022 0949   AST 21 04/10/2022 0949   ALT 22 04/10/2022 0949   ALKPHOS 69 04/10/2022 0949   BILITOT 0.3 04/10/2022 0949      Component Value Date/Time   TSH 2.220 03/29/2021 0837     ASSESSMENT AND PLAN  Obesity,current BMI 28.1  TREATMENT PLAN FOR OBESITY:  Recommended Dietary Goals  Sheila Guerra is currently in the action stage of change. As such,  her goal is to continue with weight loss efforts. She has agreed to the Category 2 Plan.  Behavioral Intervention  We discussed the following Behavioral Modification Strategies today: increasing lean protein intake, increasing water intake, and emotional eating strategies.   Recommended Physical Activity Goals  Sheila Guerra has been instructed to work up to 150 minutes of moderate intensity aerobic activity a week and strengthening exercises 2-3 times per week for cardiovascular health, weight loss maintenance and preservation of muscle mass.   She  has agreed to begin to do video guided exercises at home with a target of 150 minutes a week.  We also reviewed the benefits of strengthening exercises and weight management.  Pharmacotherapy We discussed various medication options to help Sheila Guerra with her weight loss efforts and we both agreed to continue tirzepatide.  Medication refilled.  ADDRESSED ASSOCIATED COMORBID CONDITIONS  2. Type 2 diabetes mellitus with hyperglycemia, without long-term current use of insulin ([name]) Her most recent A1c was 4.8 and down from 6.8.  Her metformin has been reduced to once a day by PCP.  She continues on Mounjaro without any adverse effects.  She is on aspirin and high intensity statin therapy for cardiovascular risk reduction.  Her blood pressure is above goal.  She is not on ACE or ARB.  She will continue with weight loss therapy, Mounjaro and metformin.  3. Hypertension associated with diabetes ([place]) Blood pressure not at goal.  Blood pressure goal is less than 130/80.  She is currently on labetalol twice a day.  She has had a urine microalbumin done outside the system but I cannot download results to see if she benefits from an ACE or ARB.  I counseled patient on monitoring blood pressure at home twice a day in the morning and before bedtime as elevations during this time may increase her risk of stroke.  She will report trends to her PCP.  Most recent renal parameters were reviewed and shows normal renal function.    Sheila Guerra has agreed to follow-up with our clinic in 3 weeks. She was informed of the importance of frequent follow-up visits to maximize her success with intensive lifestyle modifications for her multiple health conditions.  Thomes Dinning, MD

## 2022-08-28 NOTE — Progress Notes (Deleted)
Chief Complaint:   OBESITY Sheila Guerra is here to discuss her progress with her obesity treatment plan along with follow-up of her obesity related diagnoses. Sheila Guerra is on the Category 2 Plan and Sheila Guerra and states she is following her eating plan approximately 75% of the time. Sheila Guerra states she is not currently exercising.   Today's visit was #: 6 Starting weight: 190 lbs Starting date: 03/29/2021 Today's weight: 156 lb Today's date: 08/28/2022 Total lbs lost to date: 34 lbs Total lbs lost since last in-office visit: 0  Interim History: ***  Subjective:   1. Type 2 diabetes mellitus with hyperglycemia, without long-term current use of insulin ([name]) ***   Assessment/Plan:   1. Type 2 diabetes mellitus with hyperglycemia, without long-term current use of insulin ([name]) ***  Sheila Guerra {CHL AMB IS/IS NOT:210130109} currently in the action stage of change. As such, her goal is to {MWMwtloss#1:210800005}. She has agreed to {MWMwtlossportion/plan2:23431}.   Exercise goals: {MWM EXERCISE RECS:23473}  Behavioral modification strategies: {MWMwtlossdietstrategies3:23432}.  Sheila Guerra has agreed to follow-up with our clinic in {NUMBER 1-10:22536} weeks. She was informed of the importance of frequent follow-up visits to maximize her success with intensive lifestyle modifications for her multiple health conditions.   ***delete paragraph if no labs orderedVilma was informed we would discuss her lab results at her next visit unless there is a critical issue that needs to be addressed sooner. Sheila Guerra agreed to keep her next visit at the agreed upon time to discuss these results.  Objective:   Blood pressure (!) 140/82, pulse 81, temperature 98 F (36.7 C), height '5\' 2"'$  (1.575 m), weight 156 lb (70.8 kg), last menstrual period 09/14/2017, SpO2 99 %. Body mass index is 28.53 kg/m.  General: Cooperative, alert, well developed, in no acute distress. HEENT: Conjunctivae and lids  unremarkable. Cardiovascular: Regular rhythm.  Lungs: Normal work of breathing. Neurologic: No focal deficits.   Lab Results  Component Value Date   CREATININE 0.65 04/10/2022   BUN 13 04/10/2022   NA 141 04/10/2022   K 4.6 04/10/2022   CL 105 04/10/2022   CO2 22 04/10/2022   Lab Results  Component Value Date   ALT 22 04/10/2022   AST 21 04/10/2022   ALKPHOS 69 04/10/2022   BILITOT 0.3 04/10/2022   Lab Results  Component Value Date   HGBA1C 4.8 04/10/2022   HGBA1C 5.2 09/12/2021   HGBA1C 6.8 (H) 03/29/2021   Lab Results  Component Value Date   INSULIN 11.8 04/10/2022   INSULIN 11.9 09/12/2021   INSULIN 53.3 (H) 03/29/2021   Lab Results  Component Value Date   TSH 2.220 03/29/2021   Lab Results  Component Value Date   CHOL 139 04/10/2022   HDL 57 04/10/2022   LDLCALC 64 04/10/2022   TRIG 94 04/10/2022   Lab Results  Component Value Date   VD25OH 76.4 04/10/2022   VD25OH 58.2 09/12/2021   VD25OH 38.4 03/29/2021   Lab Results  Component Value Date   WBC 8.0 03/29/2021   HGB 12.6 03/29/2021   HCT 38.0 03/29/2021   MCV 90 03/29/2021   PLT 272 03/29/2021   No results found for: "IRON", "TIBC", "FERRITIN"  Obesity Behavioral Intervention:   Approximately 15 minutes were spent on the discussion below.  ASK: We discussed the diagnosis of obesity with Sheila Guerra today and Sheila Guerra agreed to give [place] permission to discuss obesity behavioral modification therapy today.  ASSESS: Sheila Guerra has the diagnosis of obesity and her BMI today is ***. Sheila Guerra {ACTION;  IS/IS LTR:[id number]} in the action stage of change.   ADVISE: Sheila Guerra was educated on the multiple health risks of obesity as well as the benefit of weight loss to improve her health. She was advised of the need for long term treatment and the importance of lifestyle modifications to improve her current health and to decrease her risk of future health problems.  AGREE: Multiple dietary modification options and  treatment options were discussed and Sheila Guerra agreed to follow the recommendations documented in the above note.  ARRANGE: Sheila Guerra was educated on the importance of frequent visits to treat obesity as outlined per CMS and USPSTF guidelines and agreed to schedule her next follow up appointment today.  Attestation Statements:   Reviewed by clinician on day of visit: allergies, medications, problem list, medical history, surgical history, family history, social history, and previous encounter notes.  ***(delete if time-based billing not used)Time spent on visit including pre-visit chart review and post-visit care and charting was *** minutes.   I, ***, am acting as transcriptionist for ***.  I have reviewed the above documentation for accuracy and completeness, and I agree with the above. -  ***

## 2022-09-05 DIAGNOSIS — R030 Elevated blood-pressure reading, without diagnosis of hypertension: Secondary | ICD-10-CM | POA: Diagnosis not present

## 2022-09-05 DIAGNOSIS — Z6829 Body mass index (BMI) 29.0-29.9, adult: Secondary | ICD-10-CM | POA: Diagnosis not present

## 2022-09-05 DIAGNOSIS — J069 Acute upper respiratory infection, unspecified: Secondary | ICD-10-CM | POA: Diagnosis not present

## 2022-09-05 DIAGNOSIS — R059 Cough, unspecified: Secondary | ICD-10-CM | POA: Diagnosis not present

## 2022-09-18 DIAGNOSIS — G4733 Obstructive sleep apnea (adult) (pediatric): Secondary | ICD-10-CM | POA: Diagnosis not present

## 2022-09-19 DIAGNOSIS — G4733 Obstructive sleep apnea (adult) (pediatric): Secondary | ICD-10-CM | POA: Diagnosis not present

## 2022-09-24 DIAGNOSIS — Z114 Encounter for screening for human immunodeficiency virus [HIV]: Secondary | ICD-10-CM | POA: Diagnosis not present

## 2022-09-24 DIAGNOSIS — Z6829 Body mass index (BMI) 29.0-29.9, adult: Secondary | ICD-10-CM | POA: Diagnosis not present

## 2022-09-24 DIAGNOSIS — Z0000 Encounter for general adult medical examination without abnormal findings: Secondary | ICD-10-CM | POA: Diagnosis not present

## 2022-09-24 DIAGNOSIS — I10 Essential (primary) hypertension: Secondary | ICD-10-CM | POA: Diagnosis not present

## 2022-09-24 DIAGNOSIS — Z79899 Other long term (current) drug therapy: Secondary | ICD-10-CM | POA: Diagnosis not present

## 2022-09-24 DIAGNOSIS — E1140 Type 2 diabetes mellitus with diabetic neuropathy, unspecified: Secondary | ICD-10-CM | POA: Diagnosis not present

## 2022-09-24 DIAGNOSIS — E782 Mixed hyperlipidemia: Secondary | ICD-10-CM | POA: Diagnosis not present

## 2022-09-24 DIAGNOSIS — R0602 Shortness of breath: Secondary | ICD-10-CM | POA: Diagnosis not present

## 2022-09-24 DIAGNOSIS — Z139 Encounter for screening, unspecified: Secondary | ICD-10-CM | POA: Diagnosis not present

## 2022-09-24 DIAGNOSIS — Z1159 Encounter for screening for other viral diseases: Secondary | ICD-10-CM | POA: Diagnosis not present

## 2022-09-24 DIAGNOSIS — E559 Vitamin D deficiency, unspecified: Secondary | ICD-10-CM | POA: Diagnosis not present

## 2022-09-25 ENCOUNTER — Encounter (INDEPENDENT_AMBULATORY_CARE_PROVIDER_SITE_OTHER): Payer: Self-pay | Admitting: Family Medicine

## 2022-09-25 ENCOUNTER — Ambulatory Visit (INDEPENDENT_AMBULATORY_CARE_PROVIDER_SITE_OTHER): Payer: BC Managed Care – PPO | Admitting: Family Medicine

## 2022-09-25 VITALS — BP 158/90 | HR 78 | Temp 97.8°F | Ht 62.0 in | Wt 154.0 lb

## 2022-09-25 DIAGNOSIS — G4733 Obstructive sleep apnea (adult) (pediatric): Secondary | ICD-10-CM

## 2022-09-25 DIAGNOSIS — Z6828 Body mass index (BMI) 28.0-28.9, adult: Secondary | ICD-10-CM

## 2022-09-25 DIAGNOSIS — I152 Hypertension secondary to endocrine disorders: Secondary | ICD-10-CM | POA: Diagnosis not present

## 2022-09-25 DIAGNOSIS — E669 Obesity, unspecified: Secondary | ICD-10-CM

## 2022-09-25 DIAGNOSIS — E1159 Type 2 diabetes mellitus with other circulatory complications: Secondary | ICD-10-CM | POA: Diagnosis not present

## 2022-09-25 NOTE — Progress Notes (Deleted)
Last two appointments she has seen Dr. Valetta Close and Dr. Gerarda Fraction.  She started the resistance bands and has been consistent with her activity incorporation.  She has stayed mindful of food intake.  She has been able to incorporate 3-4x a week of resistance training. Has quite a bit coming up with her family in the next few weeks.  Does think it is doable to continue on Cat 2.

## 2022-10-05 DIAGNOSIS — G4733 Obstructive sleep apnea (adult) (pediatric): Secondary | ICD-10-CM | POA: Diagnosis not present

## 2022-10-05 NOTE — Progress Notes (Signed)
Chief Complaint:   OBESITY Sheila Guerra is here to discuss her progress with her obesity treatment plan along with follow-up of her obesity related diagnoses. Sheila Guerra is on the Category 2 Plan and states she is following her eating plan approximately 75-80% of the time. Sheila Guerra states she is using resistance bands 5-10 minutes 3-4 times per week.  Today's visit was #: 20 Starting weight: 190 lbs Starting date: 03/29/2021 Today's weight: 154 lbs Today's date: 09/25/2022 Total lbs lost to date: 36 lbs Total lbs lost since last in-office visit: 2  Interim History: Last two appointments Sheila Guerra has seen Dr. Valetta Close and Dr. Gerarda Fraction.  She started the resistance bands and has been consistent with her activity incorporation.  She has stayed mindful of food intake.  She has been able to incorporate 3-4x a week of resistance training. Has quite a bit coming up with her family in the next few weeks.  Does think it is doable to continue on Cat 2.  Subjective:   1. OSA (obstructive sleep apnea) Sheila Guerra has a CPAP machine but has noticed malfunctioning air since last year.  She has had a new sleep test in order is placed for a new machine.  She is not sleeping well with current machine.  2. Hypertension associated with diabetes (Sheila Guerra) Blood pressure elevated today.  Denies chest pain, chest pressure and headache.  Patient voices significant decrease in sleep quality.  Blood pressure at home more often controlled than at her appointments.  Assessment/Plan:   1. OSA (obstructive sleep apnea) Will follow-up with sleep physician and new CPAP machine.  2. Hypertension associated with diabetes (Sheila Guerra) Will follow-up with blood pressure after new CPAP machine.  Continue labetalol without changes in dose.  If blood pressure not better controlled with CPAP will add medications.  3. Obesity,current BMI 28.2 Sheila Guerra is currently in the action stage of change. As such, her goal is to continue with weight loss efforts. She  has agreed to the Category 2 Plan.   Exercise goals: All adults should avoid inactivity. Some physical activity is better than none, and adults who participate in any amount of physical activity gain some health benefits.  Behavioral modification strategies: increasing lean protein intake, meal planning and cooking strategies, keeping healthy foods in the home, and planning for success.  Sheila Guerra has agreed to follow-up with our clinic in 4 weeks. She was informed of the importance of frequent follow-up visits to maximize her success with intensive lifestyle modifications for her multiple health conditions.   Objective:   Blood pressure (!) 158/90, pulse 78, temperature 97.8 F (36.6 C), height '5\' 2"'$  (1.575 m), weight 154 lb (69.9 kg), last menstrual period 09/14/2017, SpO2 100 %. Body mass index is 28.17 kg/m.  General: Cooperative, alert, well developed, in no acute distress. HEENT: Conjunctivae and lids unremarkable. Cardiovascular: Regular rhythm.  Lungs: Normal work of breathing. Neurologic: No focal deficits.   Lab Results  Component Value Date   CREATININE 0.65 04/10/2022   BUN 13 04/10/2022   NA 141 04/10/2022   K 4.6 04/10/2022   CL 105 04/10/2022   CO2 22 04/10/2022   Lab Results  Component Value Date   ALT 22 04/10/2022   AST 21 04/10/2022   ALKPHOS 69 04/10/2022   BILITOT 0.3 04/10/2022   Lab Results  Component Value Date   HGBA1C 4.8 04/10/2022   HGBA1C 5.2 09/12/2021   HGBA1C 6.8 (H) 03/29/2021   Lab Results  Component Value Date   INSULIN 11.8  04/10/2022   INSULIN 11.9 09/12/2021   INSULIN 53.3 (H) 03/29/2021   Lab Results  Component Value Date   TSH 2.220 03/29/2021   Lab Results  Component Value Date   CHOL 139 04/10/2022   HDL 57 04/10/2022   LDLCALC 64 04/10/2022   TRIG 94 04/10/2022   Lab Results  Component Value Date   VD25OH 76.4 04/10/2022   VD25OH 58.2 09/12/2021   VD25OH 38.4 03/29/2021   Lab Results  Component Value Date   WBC  8.0 03/29/2021   HGB 12.6 03/29/2021   HCT 38.0 03/29/2021   MCV 90 03/29/2021   PLT 272 03/29/2021   No results found for: "IRON", "TIBC", "FERRITIN"  Attestation Statements:   Reviewed by clinician on day of visit: allergies, medications, problem list, medical history, surgical history, family history, social history, and previous encounter notes.  Time spent on visit including pre-visit chart review and post-visit care and charting was 25 minutes.   I, Elnora Morrison, RMA am acting as transcriptionist for Coralie Common, MD.  I have reviewed the above documentation for accuracy and completeness, and I agree with the above. - Coralie Common, MD

## 2022-10-19 DIAGNOSIS — G4733 Obstructive sleep apnea (adult) (pediatric): Secondary | ICD-10-CM | POA: Diagnosis not present

## 2022-10-22 DIAGNOSIS — Z6829 Body mass index (BMI) 29.0-29.9, adult: Secondary | ICD-10-CM | POA: Diagnosis not present

## 2022-10-22 DIAGNOSIS — E782 Mixed hyperlipidemia: Secondary | ICD-10-CM | POA: Diagnosis not present

## 2022-10-22 DIAGNOSIS — E1140 Type 2 diabetes mellitus with diabetic neuropathy, unspecified: Secondary | ICD-10-CM | POA: Diagnosis not present

## 2022-10-22 DIAGNOSIS — I10 Essential (primary) hypertension: Secondary | ICD-10-CM | POA: Diagnosis not present

## 2022-10-22 DIAGNOSIS — K760 Fatty (change of) liver, not elsewhere classified: Secondary | ICD-10-CM | POA: Diagnosis not present

## 2022-10-24 ENCOUNTER — Ambulatory Visit (INDEPENDENT_AMBULATORY_CARE_PROVIDER_SITE_OTHER): Payer: BC Managed Care – PPO | Admitting: Family Medicine

## 2022-10-24 ENCOUNTER — Encounter (INDEPENDENT_AMBULATORY_CARE_PROVIDER_SITE_OTHER): Payer: Self-pay | Admitting: Family Medicine

## 2022-10-24 VITALS — BP 159/81 | HR 60 | Temp 97.9°F | Ht 62.0 in | Wt 155.0 lb

## 2022-10-24 DIAGNOSIS — G4733 Obstructive sleep apnea (adult) (pediatric): Secondary | ICD-10-CM | POA: Diagnosis not present

## 2022-10-24 DIAGNOSIS — Z6828 Body mass index (BMI) 28.0-28.9, adult: Secondary | ICD-10-CM

## 2022-10-24 DIAGNOSIS — E669 Obesity, unspecified: Secondary | ICD-10-CM

## 2022-10-24 DIAGNOSIS — E1159 Type 2 diabetes mellitus with other circulatory complications: Secondary | ICD-10-CM

## 2022-10-24 DIAGNOSIS — Z7984 Long term (current) use of oral hypoglycemic drugs: Secondary | ICD-10-CM

## 2022-10-24 DIAGNOSIS — I152 Hypertension secondary to endocrine disorders: Secondary | ICD-10-CM

## 2022-10-24 NOTE — Progress Notes (Deleted)
Over the last month patient has been around 75% on Cat 2.  She feels good with sticking with meal plan at this compliance.  She is dealing with allergies currently with spring coming. Had to go to Urgent Care a few days ago wit her son due to breathing issues in relation to allergies.  Planning to stay home for spring break.  Plans for the summer include going to museums and kids places.  Does anticipate summer vacations.  She is using resistance bands for strength training.

## 2022-10-31 NOTE — Progress Notes (Signed)
Chief Complaint:   OBESITY Sheila Guerra is here to discuss her progress with her obesity treatment plan along with follow-up of her obesity related diagnoses. Sheila Guerra is on the Category 2 Plan and states she is following her eating plan approximately 75% of the time. Sheila Guerra states she is strength training 5-10 minutes 3-4 times per week.  Today's visit was #: 21 Starting weight: 190 LBS Starting date: 03/29/2021 Today's weight: 155 LBS Today's date: 10/24/2022 Total lbs lost to date: 35 LBS Total lbs lost since last in-office visit: +1 LB  Interim History: Over the last month patient has been around 75% on Cat 2. She feels good with sticking with meal plan at this compliance. She is dealing with allergies currently with spring coming. Had to go to Urgent Care a few days ago wit her son due to breathing issues in relation to allergies. Planning to stay home for spring break. Plans for the summer include going to museums and kids places. Does anticipate summer vacations. She is using resistance bands for strength training.   Subjective:   1. OSA (obstructive sleep apnea) Patient has CPAP finally and has had it for 5 days and has been trying to use it but patient is struggling with allergies.  2. Hypertension associated with diabetes (Sheila Guerra) Blood pressure elevated today she feels some stress associated with blood pressure monitoring.  She is taking decongestants currently.  Assessment/Plan:   1. OSA (obstructive sleep apnea) Follow-up on sleep and allergy symptoms at next appointment.  2. Hypertension associated with diabetes (Sheila Guerra) Follow-up blood pressure at next appointment.  Patient to check blood pressure at home and bring in log and cuff to next appointment.  3. Obesity,current BMI 28.5 Sheila Guerra is currently in the action stage of change. As such, her goal is to continue with weight loss efforts. She has agreed to the Category 2 Plan.   Exercise goals:  Patient to start keeping track of  reps and total resistance.  Behavioral modification strategies: increasing lean protein intake, meal planning and cooking strategies, keeping healthy foods in the home, and planning for success.  Sheila Guerra has agreed to follow-up with our clinic in 4 weeks. She was informed of the importance of frequent follow-up visits to maximize her success with intensive lifestyle modifications for her multiple health conditions.   Objective:   Blood pressure (!) 159/81, pulse 60, temperature 97.9 F (36.6 C), height 5\' 2"  (1.575 m), weight 155 lb (70.3 kg), last menstrual period 09/14/2017, SpO2 100 %. Body mass index is 28.35 kg/m.  General: Cooperative, alert, well developed, in no acute distress. HEENT: Conjunctivae and lids unremarkable. Cardiovascular: Regular rhythm.  Lungs: Normal work of breathing. Neurologic: No focal deficits.   Lab Results  Component Value Date   CREATININE 0.65 04/10/2022   BUN 13 04/10/2022   NA 141 04/10/2022   K 4.6 04/10/2022   CL 105 04/10/2022   CO2 22 04/10/2022   Lab Results  Component Value Date   ALT 22 04/10/2022   AST 21 04/10/2022   ALKPHOS 69 04/10/2022   BILITOT 0.3 04/10/2022   Lab Results  Component Value Date   HGBA1C 4.8 04/10/2022   HGBA1C 5.2 09/12/2021   HGBA1C 6.8 (H) 03/29/2021   Lab Results  Component Value Date   INSULIN 11.8 04/10/2022   INSULIN 11.9 09/12/2021   INSULIN 53.3 (H) 03/29/2021   Lab Results  Component Value Date   TSH 2.220 03/29/2021   Lab Results  Component Value Date  CHOL 139 04/10/2022   HDL 57 04/10/2022   LDLCALC 64 04/10/2022   TRIG 94 04/10/2022   Lab Results  Component Value Date   VD25OH 76.4 04/10/2022   VD25OH 58.2 09/12/2021   VD25OH 38.4 03/29/2021   Lab Results  Component Value Date   WBC 8.0 03/29/2021   HGB 12.6 03/29/2021   HCT 38.0 03/29/2021   MCV 90 03/29/2021   PLT 272 03/29/2021   No results found for: "IRON", "TIBC", "FERRITIN"  Attestation Statements:   Reviewed  by clinician on day of visit: allergies, medications, problem list, medical history, surgical history, family history, social history, and previous encounter notes.  I, Davy Pique, RMA, am acting as transcriptionist for Coralie Common, MD.  I have reviewed the above documentation for accuracy and completeness, and I agree with the above. - Coralie Common, MD

## 2022-11-19 ENCOUNTER — Other Ambulatory Visit (INDEPENDENT_AMBULATORY_CARE_PROVIDER_SITE_OTHER): Payer: Self-pay | Admitting: Internal Medicine

## 2022-11-19 DIAGNOSIS — G4733 Obstructive sleep apnea (adult) (pediatric): Secondary | ICD-10-CM | POA: Diagnosis not present

## 2022-11-19 DIAGNOSIS — E1165 Type 2 diabetes mellitus with hyperglycemia: Secondary | ICD-10-CM

## 2022-11-28 ENCOUNTER — Encounter (INDEPENDENT_AMBULATORY_CARE_PROVIDER_SITE_OTHER): Payer: Self-pay | Admitting: Family Medicine

## 2022-11-28 ENCOUNTER — Ambulatory Visit (INDEPENDENT_AMBULATORY_CARE_PROVIDER_SITE_OTHER): Payer: BC Managed Care – PPO | Admitting: Family Medicine

## 2022-11-28 VITALS — BP 150/96 | HR 81 | Temp 97.8°F | Ht 62.0 in | Wt 156.0 lb

## 2022-11-28 DIAGNOSIS — E1159 Type 2 diabetes mellitus with other circulatory complications: Secondary | ICD-10-CM | POA: Diagnosis not present

## 2022-11-28 DIAGNOSIS — I152 Hypertension secondary to endocrine disorders: Secondary | ICD-10-CM | POA: Diagnosis not present

## 2022-11-28 DIAGNOSIS — E669 Obesity, unspecified: Secondary | ICD-10-CM | POA: Diagnosis not present

## 2022-11-28 DIAGNOSIS — Z7985 Long-term (current) use of injectable non-insulin antidiabetic drugs: Secondary | ICD-10-CM

## 2022-11-28 DIAGNOSIS — E1165 Type 2 diabetes mellitus with hyperglycemia: Secondary | ICD-10-CM

## 2022-11-28 DIAGNOSIS — Z6828 Body mass index (BMI) 28.0-28.9, adult: Secondary | ICD-10-CM

## 2022-11-28 MED ORDER — LISINOPRIL 10 MG PO TABS: 10.0000 mg | ORAL_TABLET | Freq: Every day | ORAL | 0 refills | Status: DC

## 2022-11-28 MED ORDER — MOUNJARO 5 MG/0.5ML ~~LOC~~ SOAJ: 5.0000 mg | SUBCUTANEOUS | 0 refills | Status: DC

## 2022-11-28 NOTE — Progress Notes (Signed)
Chief Complaint:   OBESITY Sheila Guerra is here to discuss her progress with her obesity treatment plan along with follow-up of her obesity related diagnoses. Lovene is on the Category 2 Plan and the Pescatarian Plan and states she is following her eating plan approximately 75-80% of the time. Sheila Guerra states she is walking and bands 15-20 minutes 3-4 times per week.  Today's visit was #: 22 Starting weight: 190 lbs Starting date: 03/29/2021 Today's weight: 156 lbs Today's date: 11/28/2022 Total lbs lost to date: 34 lbs Total lbs lost since last in-office visit: +1 lb  Interim History: Patient is doing well over the last few weeks.  She has been tracking her BP at home and it has been fairly consistent in its readings. Thinks she is in perimenopause as she is experiencing mood changes and hot flashes.   Subjective:   1. Hypertension associated with type 2 diabetes mellitus Blood pressure elevated here and at home.  She was previously on lisinopril and HCTZ, but this was changed to labetalol for pregnancy.  Patient has a history of hysterectomy, so pregnancy is not a concern.   2. Type 2 diabetes mellitus with hyperglycemia, without long-term current use of insulin Patient is on Mounjaro 5 mg weekly. Patient denies GI side effects.  Last A1c was 4.8.  Assessment/Plan:   1. Hypertension associated with type 2 diabetes mellitus Decrease labetalol to 501 mg BID, patient has a prescription. Follow up with blood pressure at next appointment.   Start - lisinopril (ZESTRIL) 10 MG tablet; Take 1 tablet (10 mg total) by mouth daily.  Dispense: 90 tablet; Refill: 0  2. Type 2 diabetes mellitus with hyperglycemia, without long-term current use of insulin Refill - tirzepatide (MOUNJARO) 5 MG/0.5ML Pen; Inject 5 mg into the skin once a week.  Dispense: 6 mL; Refill: 0  3. Obesity,current BMI 28.7 Sheila Guerra is currently in the action stage of change. As such, her goal is to continue with weight loss  efforts. She has agreed to the Category 2 Plan.  Exercise goals:  Patient instructed to focus on resistance training 3-4 times per week.   Behavioral modification strategies: increasing lean protein intake, meal planning and cooking strategies, keeping healthy foods in the home, and planning for success.  Sheila Guerra has agreed to follow-up with our clinic in 4 weeks. She was informed of the importance of frequent follow-up visits to maximize her success with intensive lifestyle modifications for her multiple health conditions.   Objective:   Blood pressure (!) 150/96, pulse 81, temperature 97.8 F (36.6 C), height  (1.575 m), weight 156 lb (70.8 kg), last menstrual period 09/14/2017, SpO2 99 %. Body mass index is 28.53 kg/m.  General: Cooperative, alert, well developed, in no acute distress. HEENT: Conjunctivae and lids unremarkable. Cardiovascular: Regular rhythm.  Lungs: Normal work of breathing. Neurologic: No focal deficits.   Lab Results  Component Value Date   CREATININE 0.65 04/10/2022   BUN 13 04/10/2022   NA 141 04/10/2022   K 4.6 04/10/2022   CL 105 04/10/2022   CO2 22 04/10/2022   Lab Results  Component Value Date   ALT 22 04/10/2022   AST 21 04/10/2022   ALKPHOS 69 04/10/2022   BILITOT 0.3 04/10/2022   Lab Results  Component Value Date   HGBA1C 4.8 04/10/2022   HGBA1C 5.2 09/12/2021   HGBA1C 6.8 (H) 03/29/2021   Lab Results  Component Value Date   INSULIN 11.8 04/10/2022   INSULIN 11.9 09/12/2021  INSULIN 53.3 (H) 03/29/2021   Lab Results  Component Value Date   TSH 2.220 03/29/2021   Lab Results  Component Value Date   CHOL 139 04/10/2022   HDL 57 04/10/2022   LDLCALC 64 04/10/2022   TRIG 94 04/10/2022   Lab Results  Component Value Date   VD25OH 76.4 04/10/2022   VD25OH 58.2 09/12/2021   VD25OH 38.4 03/29/2021   Lab Results  Component Value Date   WBC 8.0 03/29/2021   HGB 12.6 03/29/2021   HCT 38.0 03/29/2021   MCV 90 03/29/2021    PLT 272 03/29/2021   No results found for: "IRON", "TIBC", "FERRITIN"  Attestation Statements:   Reviewed by clinician on day of visit: allergies, medications, problem list, medical history, surgical history, family history, social history, and previous encounter notes.  I, Malcolm Metro, RMA, am acting as transcriptionist for Reuben Likes, MD.  I have reviewed the above documentation for accuracy and completeness, and I agree with the above. - Reuben Likes, MD

## 2022-12-19 DIAGNOSIS — G4733 Obstructive sleep apnea (adult) (pediatric): Secondary | ICD-10-CM | POA: Diagnosis not present

## 2022-12-26 ENCOUNTER — Encounter (INDEPENDENT_AMBULATORY_CARE_PROVIDER_SITE_OTHER): Payer: Self-pay | Admitting: Family Medicine

## 2022-12-26 ENCOUNTER — Ambulatory Visit (INDEPENDENT_AMBULATORY_CARE_PROVIDER_SITE_OTHER): Payer: BC Managed Care – PPO | Admitting: Family Medicine

## 2022-12-26 VITALS — BP 137/86 | HR 70 | Temp 97.9°F | Ht 62.0 in | Wt 160.0 lb

## 2022-12-26 DIAGNOSIS — E1165 Type 2 diabetes mellitus with hyperglycemia: Secondary | ICD-10-CM | POA: Diagnosis not present

## 2022-12-26 DIAGNOSIS — E1159 Type 2 diabetes mellitus with other circulatory complications: Secondary | ICD-10-CM | POA: Diagnosis not present

## 2022-12-26 DIAGNOSIS — E669 Obesity, unspecified: Secondary | ICD-10-CM | POA: Diagnosis not present

## 2022-12-26 DIAGNOSIS — I152 Hypertension secondary to endocrine disorders: Secondary | ICD-10-CM

## 2022-12-26 DIAGNOSIS — Z6829 Body mass index (BMI) 29.0-29.9, adult: Secondary | ICD-10-CM

## 2022-12-26 DIAGNOSIS — Z7985 Long-term (current) use of injectable non-insulin antidiabetic drugs: Secondary | ICD-10-CM

## 2022-12-26 MED ORDER — LISINOPRIL 10 MG PO TABS: 20.0000 mg | ORAL_TABLET | Freq: Every day | ORAL | 0 refills | Status: DC

## 2022-12-26 NOTE — Progress Notes (Unsigned)
Chief Complaint:   OBESITY Sheila Guerra is here to discuss her progress with her obesity treatment plan along with follow-up of her obesity related diagnoses. Sheila Guerra is on the Category 2 Plan and states she is following her eating plan approximately 80% of the time. Sheila Guerra states she is walking and doing strength training for 20-25 minutes 3-4 times per week.  Today's visit was #: 23 Starting weight: 190 lbs Starting date: 03/29/2021 Today's weight: 160 lbs Today's date: 12/26/2022 Total lbs lost to date: 30 Total lbs lost since last in-office visit: 0  Interim History: Patient has been working with changing her blood pressure medication. She feels very tired and is ready for school to be done.  She has been feeling more anxiousness around all the stress in her life around medication changes, getting all medications Sheila Guerra shortage), end of school stuff. Patient is planning on keeping kids home with her mother for the summer.   Subjective:   1. Hypertension associated with type 2 diabetes mellitus ([name]) Sheila Guerra has been monitoring her blood pressure at home.  She has been taking 1/2 tablet of labetalol 2 times daily, and her blood pressure is within normal limits today.  She denies chest pain, chest pressure, or headache.  2. Type 2 diabetes mellitus with hyperglycemia, without long-term current use of insulin ([name]) Sheila Guerra is on Mounjaro and Glucophage.  Her last A1c was within normal limits.  Assessment/Plan:   1. Hypertension associated with type 2 diabetes mellitus ([name]) Sheila Guerra agreed to discontinue labetalol, and increase lisinopril to 20 mg daily and we will refill for 1 month.   - lisinopril (ZESTRIL) 10 MG tablet; Take 2 tablets (20 mg total) by mouth daily.  Dispense: 90 tablet; Refill: 0  2. Type 2 diabetes mellitus with hyperglycemia, without long-term current use of insulin ([name]) Sheila Guerra will have her labs done with her PCP on May 26.  3. Obesity, with starting BMI 34.75 Sheila Guerra  is currently in the action stage of change. As such, her goal is to continue with weight loss efforts. She has agreed to the Category 2 Plan.   Exercise goals: As is.   Behavioral modification strategies: increasing lean protein intake, meal planning and cooking strategies, keeping healthy foods in the home, and planning for success.  Sheila Guerra has agreed to follow-up with our clinic in 4 weeks. She was informed of the importance of frequent follow-up visits to maximize her success with intensive lifestyle modifications for her multiple health conditions.   Objective:   Blood pressure 137/86, pulse 70, temperature 97.9 F (36.6 C), height 5\' 2"  (1.575 m), weight 160 lb (72.6 kg), last menstrual period 09/14/2017, SpO2 100 %. Body mass index is 29.26 kg/m.  General: Cooperative, alert, well developed, in no acute distress. HEENT: Conjunctivae and lids unremarkable. Cardiovascular: Regular rhythm.  Lungs: Normal work of breathing. Neurologic: No focal deficits.   Lab Results  Component Value Date   CREATININE 0.65 04/10/2022   BUN 13 04/10/2022   NA 141 04/10/2022   K 4.6 04/10/2022   CL 105 04/10/2022   CO2 22 04/10/2022   Lab Results  Component Value Date   ALT 22 04/10/2022   AST 21 04/10/2022   ALKPHOS 69 04/10/2022   BILITOT 0.3 04/10/2022   Lab Results  Component Value Date   HGBA1C 4.8 04/10/2022   HGBA1C 5.2 09/12/2021   HGBA1C 6.8 (H) 03/29/2021   Lab Results  Component Value Date   INSULIN 11.8 04/10/2022   INSULIN 11.9  09/12/2021   INSULIN 53.3 (H) 03/29/2021   Lab Results  Component Value Date   TSH 2.220 03/29/2021   Lab Results  Component Value Date   CHOL 139 04/10/2022   HDL 57 04/10/2022   LDLCALC 64 04/10/2022   TRIG 94 04/10/2022   Lab Results  Component Value Date   VD25OH 76.4 04/10/2022   VD25OH 58.2 09/12/2021   VD25OH 38.4 03/29/2021   Lab Results  Component Value Date   WBC 8.0 03/29/2021   HGB 12.6 03/29/2021   HCT 38.0  03/29/2021   MCV 90 03/29/2021   PLT 272 03/29/2021   No results found for: "IRON", "TIBC", "FERRITIN"  Attestation Statements:   Reviewed by clinician on day of visit: allergies, medications, problem list, medical history, surgical history, family history, social history, and previous encounter notes.   I, Burt Knack, am acting as transcriptionist for Reuben Likes, MD.  I have reviewed the above documentation for accuracy and completeness, and I agree with the above. - Reuben Likes, MD

## 2022-12-31 DIAGNOSIS — Z6830 Body mass index (BMI) 30.0-30.9, adult: Secondary | ICD-10-CM | POA: Diagnosis not present

## 2022-12-31 DIAGNOSIS — M5442 Lumbago with sciatica, left side: Secondary | ICD-10-CM | POA: Diagnosis not present

## 2022-12-31 DIAGNOSIS — E119 Type 2 diabetes mellitus without complications: Secondary | ICD-10-CM | POA: Diagnosis not present

## 2023-01-03 DIAGNOSIS — G4733 Obstructive sleep apnea (adult) (pediatric): Secondary | ICD-10-CM | POA: Diagnosis not present

## 2023-01-07 DIAGNOSIS — Z6829 Body mass index (BMI) 29.0-29.9, adult: Secondary | ICD-10-CM | POA: Diagnosis not present

## 2023-01-07 DIAGNOSIS — Z853 Personal history of malignant neoplasm of breast: Secondary | ICD-10-CM | POA: Diagnosis not present

## 2023-01-07 DIAGNOSIS — E1140 Type 2 diabetes mellitus with diabetic neuropathy, unspecified: Secondary | ICD-10-CM | POA: Diagnosis not present

## 2023-01-07 DIAGNOSIS — E559 Vitamin D deficiency, unspecified: Secondary | ICD-10-CM | POA: Diagnosis not present

## 2023-01-07 DIAGNOSIS — E782 Mixed hyperlipidemia: Secondary | ICD-10-CM | POA: Diagnosis not present

## 2023-01-07 DIAGNOSIS — I10 Essential (primary) hypertension: Secondary | ICD-10-CM | POA: Diagnosis not present

## 2023-01-07 DIAGNOSIS — Z79899 Other long term (current) drug therapy: Secondary | ICD-10-CM | POA: Diagnosis not present

## 2023-01-18 DIAGNOSIS — G4733 Obstructive sleep apnea (adult) (pediatric): Secondary | ICD-10-CM | POA: Diagnosis not present

## 2023-01-19 DIAGNOSIS — G4733 Obstructive sleep apnea (adult) (pediatric): Secondary | ICD-10-CM | POA: Diagnosis not present

## 2023-01-20 DIAGNOSIS — G4733 Obstructive sleep apnea (adult) (pediatric): Secondary | ICD-10-CM | POA: Diagnosis not present

## 2023-01-25 ENCOUNTER — Ambulatory Visit (INDEPENDENT_AMBULATORY_CARE_PROVIDER_SITE_OTHER): Payer: BC Managed Care – PPO | Admitting: Family Medicine

## 2023-01-25 ENCOUNTER — Encounter (INDEPENDENT_AMBULATORY_CARE_PROVIDER_SITE_OTHER): Payer: Self-pay | Admitting: Family Medicine

## 2023-01-25 VITALS — BP 145/90 | HR 75 | Temp 97.5°F | Ht 62.0 in | Wt 158.0 lb

## 2023-01-25 DIAGNOSIS — E785 Hyperlipidemia, unspecified: Secondary | ICD-10-CM

## 2023-01-25 DIAGNOSIS — I152 Hypertension secondary to endocrine disorders: Secondary | ICD-10-CM

## 2023-01-25 DIAGNOSIS — E559 Vitamin D deficiency, unspecified: Secondary | ICD-10-CM

## 2023-01-25 DIAGNOSIS — Z6828 Body mass index (BMI) 28.0-28.9, adult: Secondary | ICD-10-CM

## 2023-01-25 DIAGNOSIS — E1169 Type 2 diabetes mellitus with other specified complication: Secondary | ICD-10-CM

## 2023-01-25 DIAGNOSIS — E1165 Type 2 diabetes mellitus with hyperglycemia: Secondary | ICD-10-CM | POA: Diagnosis not present

## 2023-01-25 DIAGNOSIS — E1159 Type 2 diabetes mellitus with other circulatory complications: Secondary | ICD-10-CM

## 2023-01-25 DIAGNOSIS — R7989 Other specified abnormal findings of blood chemistry: Secondary | ICD-10-CM

## 2023-01-25 DIAGNOSIS — Z7985 Long-term (current) use of injectable non-insulin antidiabetic drugs: Secondary | ICD-10-CM

## 2023-01-25 DIAGNOSIS — Z6829 Body mass index (BMI) 29.0-29.9, adult: Secondary | ICD-10-CM

## 2023-01-25 DIAGNOSIS — E669 Obesity, unspecified: Secondary | ICD-10-CM

## 2023-01-25 NOTE — Progress Notes (Unsigned)
Chief Complaint:   OBESITY Sheila Guerra is here to discuss her progress with her obesity treatment plan along with follow-up of her obesity related diagnoses. Sheila Guerra is on the Category 2 Plan and states she is following her eating plan approximately 75-80% of the time. Sheila Guerra states she is walking for 30 minutes 5 times per week.  Today's visit was #: 24 Starting weight: 190 lbs Starting date: 03/29/2021 Today's weight: 158 lbs Today's date: 01/25/2023 Total lbs lost to date: 32 Total lbs lost since last in-office visit: 2  Interim History: Patient has been back on track.  She just started walking and is trying to get 30 minutes about 4 days a week in.  She does do some strength training but not as consistently. She has been able to get all the food in on plan.  Some days she feels full and has to force the food in. No upcoming plans for July 4th.  Knows she needs to be mindful of quantity of food especially as summer progresses.  Subjective:   1. Hypertension associated with diabetes ([name]) Patient's blood pressure is elevated today. PCP took her off Labetolol and switched lisinopril to lisinopril-hydrochlorothiazide. Has a follow-up on June 26th.   2. Type 2 diabetes mellitus with hyperglycemia, without long-term current use of insulin ([name]) Patient is on Mounjaro 5 mg. She has been looking for [facility] 1 week prior to running out. Recent A1c was 5. I discussed labs with the patient today.   3. Hyperlipidemia associated with type 2 diabetes mellitus ([name]) Patient's last HDL was 56, LDL 83, and triglycerides 84. She is on Crestor.   4. Elevated PTHrP level Patient is a big kidney stone producer since she was young. PTH significantly elevated at 126.  5. Vitamin D deficiency Patient is on prescription Vitamin D, and her current level was 76. She notes her fatigue is better.   Assessment/Plan:   1. Hypertension associated with diabetes [facility]) Patient will follow-up with her PCP in 2  weeks.   2. Type 2 diabetes mellitus with hyperglycemia, without long-term current use of insulin [facility]) Patient will continue her current Mounjaro dose.   3. Hyperlipidemia associated with type 2 diabetes mellitus ([name]) We will follow-up on labs in 4-6 month. No change in medications.   4. Elevated PTHrP level Patient will follow-up with Dr. Katrinka Blazing for further guidance.   5. Vitamin D deficiency Patient may stop prescription and can go to OTC 2,000-5,000 IU daily.  6. BMI 29.0-29.9,adult  7. Obesity, with starting BMI 34.75 Sheila Guerra is currently in the action stage of change. As such, her goal is to continue with weight loss efforts. She has agreed to the Category 2 Plan.   Exercise goals: As is.  Behavioral modification strategies: increasing lean protein intake, meal planning and cooking strategies, keeping healthy foods in the home, and planning for success.  Sheila Guerra has agreed to follow-up with our clinic in 5 weeks. She was informed of the importance of frequent follow-up visits to maximize her success with intensive lifestyle modifications for her multiple health conditions.   Objective:   Blood pressure (!) 145/90, pulse 75, temperature (!) 97.5 F (36.4 C), height 5\' 2"  (1.575 m), weight 158 lb (71.7 kg), last menstrual period 09/14/2017, SpO2 99 %. Body mass index is 28.9 kg/m.  General: Cooperative, alert, well developed, in no acute distress. HEENT: Conjunctivae and lids unremarkable. Cardiovascular: Regular rhythm.  Lungs: Normal work of breathing. Neurologic: No focal deficits.   Lab Results  Component Value Date   CREATININE 0.65 04/10/2022   BUN 13 04/10/2022   NA 141 04/10/2022   K 4.6 04/10/2022   CL 105 04/10/2022   CO2 22 04/10/2022   Lab Results  Component Value Date   ALT 22 04/10/2022   AST 21 04/10/2022   ALKPHOS 69 04/10/2022   BILITOT 0.3 04/10/2022   Lab Results  Component Value Date   HGBA1C 4.8 04/10/2022   HGBA1C 5.2 09/12/2021   HGBA1C  6.8 (H) 03/29/2021   Lab Results  Component Value Date   INSULIN 11.8 04/10/2022   INSULIN 11.9 09/12/2021   INSULIN 53.3 (H) 03/29/2021   Lab Results  Component Value Date   TSH 2.220 03/29/2021   Lab Results  Component Value Date   CHOL 139 04/10/2022   HDL 57 04/10/2022   LDLCALC 64 04/10/2022   TRIG 94 04/10/2022   Lab Results  Component Value Date   VD25OH 76.4 04/10/2022   VD25OH 58.2 09/12/2021   VD25OH 38.4 03/29/2021   Lab Results  Component Value Date   WBC 8.0 03/29/2021   HGB 12.6 03/29/2021   HCT 38.0 03/29/2021   MCV 90 03/29/2021   PLT 272 03/29/2021   No results found for: "IRON", "TIBC", "FERRITIN"  Attestation Statements:   Reviewed by clinician on day of visit: allergies, medications, problem list, medical history, surgical history, family history, social history, and previous encounter notes.   I, Burt Knack, am acting as transcriptionist for Reuben Likes, MD.  I have reviewed the above documentation for accuracy and completeness, and I agree with the above. - Reuben Likes, MD

## 2023-02-07 DIAGNOSIS — Z6829 Body mass index (BMI) 29.0-29.9, adult: Secondary | ICD-10-CM | POA: Diagnosis not present

## 2023-02-07 DIAGNOSIS — I10 Essential (primary) hypertension: Secondary | ICD-10-CM | POA: Diagnosis not present

## 2023-02-07 DIAGNOSIS — E782 Mixed hyperlipidemia: Secondary | ICD-10-CM | POA: Diagnosis not present

## 2023-02-07 DIAGNOSIS — K760 Fatty (change of) liver, not elsewhere classified: Secondary | ICD-10-CM | POA: Diagnosis not present

## 2023-02-07 DIAGNOSIS — E1140 Type 2 diabetes mellitus with diabetic neuropathy, unspecified: Secondary | ICD-10-CM | POA: Diagnosis not present

## 2023-02-17 DIAGNOSIS — G4733 Obstructive sleep apnea (adult) (pediatric): Secondary | ICD-10-CM | POA: Diagnosis not present

## 2023-02-18 DIAGNOSIS — G4733 Obstructive sleep apnea (adult) (pediatric): Secondary | ICD-10-CM | POA: Diagnosis not present

## 2023-02-22 ENCOUNTER — Other Ambulatory Visit (INDEPENDENT_AMBULATORY_CARE_PROVIDER_SITE_OTHER): Payer: Self-pay | Admitting: Family Medicine

## 2023-02-22 DIAGNOSIS — E1165 Type 2 diabetes mellitus with hyperglycemia: Secondary | ICD-10-CM

## 2023-03-01 ENCOUNTER — Encounter (INDEPENDENT_AMBULATORY_CARE_PROVIDER_SITE_OTHER): Payer: Self-pay | Admitting: Family Medicine

## 2023-03-01 ENCOUNTER — Ambulatory Visit (INDEPENDENT_AMBULATORY_CARE_PROVIDER_SITE_OTHER): Payer: BC Managed Care – PPO | Admitting: Family Medicine

## 2023-03-01 VITALS — BP 146/92 | HR 79 | Temp 97.9°F | Ht 62.0 in | Wt 163.0 lb

## 2023-03-01 DIAGNOSIS — Z6829 Body mass index (BMI) 29.0-29.9, adult: Secondary | ICD-10-CM

## 2023-03-01 DIAGNOSIS — E669 Obesity, unspecified: Secondary | ICD-10-CM

## 2023-03-01 DIAGNOSIS — E1159 Type 2 diabetes mellitus with other circulatory complications: Secondary | ICD-10-CM

## 2023-03-01 DIAGNOSIS — I152 Hypertension secondary to endocrine disorders: Secondary | ICD-10-CM

## 2023-03-01 DIAGNOSIS — E1165 Type 2 diabetes mellitus with hyperglycemia: Secondary | ICD-10-CM | POA: Diagnosis not present

## 2023-03-01 DIAGNOSIS — Z6834 Body mass index (BMI) 34.0-34.9, adult: Secondary | ICD-10-CM

## 2023-03-01 DIAGNOSIS — Z7985 Long-term (current) use of injectable non-insulin antidiabetic drugs: Secondary | ICD-10-CM

## 2023-03-01 MED ORDER — MOUNJARO 5 MG/0.5ML ~~LOC~~ SOAJ: 5.0000 mg | SUBCUTANEOUS | 0 refills | Status: DC

## 2023-03-01 NOTE — Progress Notes (Unsigned)
Chief Complaint:   OBESITY Sheila Guerra is here to discuss her progress with her obesity treatment plan along with follow-up of her obesity related diagnoses. Sheila Guerra is on the Category 2 Plan and states she is following her eating plan approximately 75% of the time. Sheila Guerra states she is walking for 30 minutes 4 times per week.  Today's visit was #: 25 Starting weight: 190 lbs Starting date: 03/29/2021 Today's weight: 163 lbs Today's date: 03/01/2023 Total lbs lost to date: 27 Total lbs lost since last in-office visit: 0  Interim History: Patient went to her PCP and is dealing with hot flashes significantly currently.  She started an over the counter supplement for menopause recently.  She thinks she has been following her meal plan fairly strictly as well.  She mentions she has been drinking water in an increased capacity to compensate for her being outside and walking. Doing a few days of each plan. She is still weighing out the protein and making sure she is getting in between 6-8oz. She is leaving for 5 days next weekend for [facility].   Subjective:   1. Hypertension associated with diabetes ([name]) Patient's blood pressure is elevated today.  Since her last appointment with her PCP her hot flashes have increased significantly.  2. Type 2 diabetes mellitus with hyperglycemia, without long-term current use of insulin ([name]) Patient is on Mounjaro 5 mg subcu weekly.  She feels good on this dose.  Assessment/Plan:   1. Hypertension associated with diabetes ([name]) We will follow-up on her blood pressure at her next appointment (patient was encouraged to reach out to her GYN for further management of her hot flashes).   2. Type 2 diabetes mellitus with hyperglycemia, without long-term current use of insulin ([name]) Patient will continue Mounjaro 5 mg once weekly, and we will refill for 90 days.  - tirzepatide [facility]) 5 MG/0.5ML Pen; Inject 5 mg into the skin once a week.  Dispense: 6 mL;  Refill: 0  3. BMI 29.0-29.9,adult  4. Obesity, with starting BMI 34.75 Sheila Guerra is currently in the action stage of change. As such, her goal is to continue with weight loss efforts. She has agreed to the Category 2 Plan and the Pescatarian Plan.   We will repeat fasting IC at her next appointment.   Exercise goals: Patient is to add in strength training 3 times per week.  Behavioral modification strategies: increasing lean protein intake, meal planning and cooking strategies, keeping healthy foods in the home, and planning for success.  Sheila Guerra has agreed to follow-up with our clinic in 6 to 7 weeks. She was informed of the importance of frequent follow-up visits to maximize her success with intensive lifestyle modifications for her multiple health conditions.   Objective:   Blood pressure (!) 146/92, pulse 79, temperature 97.9 F (36.6 C), height 5\' 2"  (1.575 m), weight 163 lb (73.9 kg), last menstrual period 09/14/2017, SpO2 100%. Body mass index is 29.81 kg/m.  General: Cooperative, alert, well developed, in no acute distress. HEENT: Conjunctivae and lids unremarkable. Cardiovascular: Regular rhythm.  Lungs: Normal work of breathing. Neurologic: No focal deficits.   Lab Results  Component Value Date   CREATININE 0.65 04/10/2022   BUN 13 04/10/2022   NA 141 04/10/2022   K 4.6 04/10/2022   CL 105 04/10/2022   CO2 22 04/10/2022   Lab Results  Component Value Date   ALT 22 04/10/2022   AST 21 04/10/2022   ALKPHOS 69 04/10/2022   BILITOT  0.3 04/10/2022   Lab Results  Component Value Date   HGBA1C 4.8 04/10/2022   HGBA1C 5.2 09/12/2021   HGBA1C 6.8 (H) 03/29/2021   Lab Results  Component Value Date   INSULIN 11.8 04/10/2022   INSULIN 11.9 09/12/2021   INSULIN 53.3 (H) 03/29/2021   Lab Results  Component Value Date   TSH 2.220 03/29/2021   Lab Results  Component Value Date   CHOL 139 04/10/2022   HDL 57 04/10/2022   LDLCALC 64 04/10/2022   TRIG 94 04/10/2022    Lab Results  Component Value Date   VD25OH 76.4 04/10/2022   VD25OH 58.2 09/12/2021   VD25OH 38.4 03/29/2021   Lab Results  Component Value Date   WBC 8.0 03/29/2021   HGB 12.6 03/29/2021   HCT 38.0 03/29/2021   MCV 90 03/29/2021   PLT 272 03/29/2021   No results found for: "IRON", "TIBC", "FERRITIN"  Attestation Statements:   Reviewed by clinician on day of visit: allergies, medications, problem list, medical history, surgical history, family history, social history, and previous encounter notes.   I, Burt Knack, am acting as transcriptionist for Reuben Likes, MD.  I have reviewed the above documentation for accuracy and completeness, and I agree with the above. - Reuben Likes, MD

## 2023-03-08 ENCOUNTER — Encounter (INDEPENDENT_AMBULATORY_CARE_PROVIDER_SITE_OTHER): Payer: Self-pay | Admitting: Family Medicine

## 2023-03-08 NOTE — Telephone Encounter (Signed)
Check with Dr Marquis Lunch

## 2023-03-20 DIAGNOSIS — G4733 Obstructive sleep apnea (adult) (pediatric): Secondary | ICD-10-CM | POA: Diagnosis not present

## 2023-03-21 DIAGNOSIS — G4733 Obstructive sleep apnea (adult) (pediatric): Secondary | ICD-10-CM | POA: Diagnosis not present

## 2023-04-06 DIAGNOSIS — D0510 Intraductal carcinoma in situ of unspecified breast: Secondary | ICD-10-CM | POA: Diagnosis not present

## 2023-04-06 DIAGNOSIS — N951 Menopausal and female climacteric states: Secondary | ICD-10-CM | POA: Diagnosis not present

## 2023-04-06 DIAGNOSIS — R232 Flushing: Secondary | ICD-10-CM | POA: Diagnosis not present

## 2023-04-06 DIAGNOSIS — Z01419 Encounter for gynecological examination (general) (routine) without abnormal findings: Secondary | ICD-10-CM | POA: Diagnosis not present

## 2023-04-06 DIAGNOSIS — Z1151 Encounter for screening for human papillomavirus (HPV): Secondary | ICD-10-CM | POA: Diagnosis not present

## 2023-04-06 DIAGNOSIS — R61 Generalized hyperhidrosis: Secondary | ICD-10-CM | POA: Diagnosis not present

## 2023-04-09 ENCOUNTER — Ambulatory Visit (INDEPENDENT_AMBULATORY_CARE_PROVIDER_SITE_OTHER): Payer: BC Managed Care – PPO | Admitting: Family Medicine

## 2023-04-09 ENCOUNTER — Encounter (INDEPENDENT_AMBULATORY_CARE_PROVIDER_SITE_OTHER): Payer: Self-pay | Admitting: Family Medicine

## 2023-04-09 VITALS — BP 128/85 | HR 75 | Temp 97.8°F | Ht 62.0 in | Wt 165.0 lb

## 2023-04-09 DIAGNOSIS — E1159 Type 2 diabetes mellitus with other circulatory complications: Secondary | ICD-10-CM

## 2023-04-09 DIAGNOSIS — E669 Obesity, unspecified: Secondary | ICD-10-CM

## 2023-04-09 DIAGNOSIS — R0602 Shortness of breath: Secondary | ICD-10-CM

## 2023-04-09 DIAGNOSIS — Z6830 Body mass index (BMI) 30.0-30.9, adult: Secondary | ICD-10-CM

## 2023-04-09 DIAGNOSIS — I152 Hypertension secondary to endocrine disorders: Secondary | ICD-10-CM | POA: Diagnosis not present

## 2023-04-09 NOTE — Progress Notes (Signed)
Chief Complaint:   OBESITY Sheila Guerra is here to discuss her progress with her obesity treatment plan along with follow-up of her obesity related diagnoses. Sheila Guerra is on the Category 2 Plan and the Pescatarian Plan and states she is following her eating plan approximately 80% of the time. Sheila Guerra states she is walking for 30 minutes 4 times per week.  Today's visit was #: 26 Starting weight: 190 lbs Starting date: 03/29/2021 Today's weight: 165 lbs Today's date: 04/09/2023 Total lbs lost to date: 25 Total lbs lost since last in-office visit: 0  Interim History: Patient's children went back to school two weeks ago. Since that time she has gotten a slightly more routined.  She went to [facility] since last appointment and she declined antidepressant which he had discussed with her.  Subjective:   1. SOBOE (shortness of breath on exertion) Patient's last IC was 1555 on 03/29/2021.  She has hit a weight loss plateau and she is feeling somewhat discouraged.  She has been doing intermittent activity.  2. Hypertension associated with diabetes ([name]) Patient's blood pressure is normal today.  She denies chest pain, chest pressure, or headache.  She is on lisinopril-hydrochlorothiazide.  Her blood pressure today was 128/85.  Assessment/Plan:   1. SOBOE (shortness of breath on exertion) Repeat IC today was of 1944; increased fruit intake to 1600 calories daily and 90 grams of protein, or category 3, or pescatarian +300 calories.  2. Hypertension associated with diabetes [facility]) Patient will continue her current medications with no change in dose.  3. BMI 30.0-30.9,adult  4. Obesity, with starting BMI 34.75 Sheila Guerra is currently in the action stage of change. As such, her goal is to continue with weight loss efforts. She has agreed to the Category 3 Plan and the Pescatarian Plan + 300 calories.   Exercise goals: All adults should avoid inactivity. Some physical activity is better than none, and adults who  participate in any amount of physical activity gain some health benefits.  Patient is to add in 15 minutes 3 times per week of resistance training.  Behavioral modification strategies: increasing lean protein intake, meal planning and cooking strategies, keeping healthy foods in the home, and planning for success.  Sheila Guerra has agreed to follow-up with our clinic in 5 weeks. She was informed of the importance of frequent follow-up visits to maximize her success with intensive lifestyle modifications for her multiple health conditions.   Objective:   Blood pressure 128/85, pulse 75, temperature 97.8 F (36.6 C), height 5\' 2"  (1.575 m), weight 165 lb (74.8 kg), last menstrual period 09/14/2017, SpO2 100%. Body mass index is 30.18 kg/m.  General: Cooperative, alert, well developed, in no acute distress. HEENT: Conjunctivae and lids unremarkable. Cardiovascular: Regular rhythm.  Lungs: Normal work of breathing. Neurologic: No focal deficits.   Lab Results  Component Value Date   CREATININE 0.65 04/10/2022   BUN 13 04/10/2022   NA 141 04/10/2022   K 4.6 04/10/2022   CL 105 04/10/2022   CO2 22 04/10/2022   Lab Results  Component Value Date   ALT 22 04/10/2022   AST 21 04/10/2022   ALKPHOS 69 04/10/2022   BILITOT 0.3 04/10/2022   Lab Results  Component Value Date   HGBA1C 4.8 04/10/2022   HGBA1C 5.2 09/12/2021   HGBA1C 6.8 (H) 03/29/2021   Lab Results  Component Value Date   INSULIN 11.8 04/10/2022   INSULIN 11.9 09/12/2021   INSULIN 53.3 (H) 03/29/2021   Lab Results  Component  Value Date   TSH 2.220 03/29/2021   Lab Results  Component Value Date   CHOL 139 04/10/2022   HDL 57 04/10/2022   LDLCALC 64 04/10/2022   TRIG 94 04/10/2022   Lab Results  Component Value Date   VD25OH 76.4 04/10/2022   VD25OH 58.2 09/12/2021   VD25OH 38.4 03/29/2021   Lab Results  Component Value Date   WBC 8.0 03/29/2021   HGB 12.6 03/29/2021   HCT 38.0 03/29/2021   MCV 90  03/29/2021   PLT 272 03/29/2021   No results found for: "IRON", "TIBC", "FERRITIN"  Attestation Statements:   Reviewed by clinician on day of visit: allergies, medications, problem list, medical history, surgical history, family history, social history, and previous encounter notes.   I, Burt Knack, am acting as transcriptionist for Reuben Likes, MD.  I have reviewed the above documentation for accuracy and completeness, and I agree with the above. - Reuben Likes, MD

## 2023-04-11 DIAGNOSIS — Z9882 Breast implant status: Secondary | ICD-10-CM | POA: Diagnosis not present

## 2023-04-12 ENCOUNTER — Ambulatory Visit (INDEPENDENT_AMBULATORY_CARE_PROVIDER_SITE_OTHER): Payer: BC Managed Care – PPO | Admitting: Family Medicine

## 2023-04-21 DIAGNOSIS — G4733 Obstructive sleep apnea (adult) (pediatric): Secondary | ICD-10-CM | POA: Diagnosis not present

## 2023-04-23 DIAGNOSIS — R92323 Mammographic fibroglandular density, bilateral breasts: Secondary | ICD-10-CM | POA: Diagnosis not present

## 2023-04-23 DIAGNOSIS — Z9882 Breast implant status: Secondary | ICD-10-CM | POA: Diagnosis not present

## 2023-04-23 DIAGNOSIS — G4733 Obstructive sleep apnea (adult) (pediatric): Secondary | ICD-10-CM | POA: Diagnosis not present

## 2023-04-29 DIAGNOSIS — E1140 Type 2 diabetes mellitus with diabetic neuropathy, unspecified: Secondary | ICD-10-CM | POA: Diagnosis not present

## 2023-04-29 DIAGNOSIS — E559 Vitamin D deficiency, unspecified: Secondary | ICD-10-CM | POA: Diagnosis not present

## 2023-04-29 DIAGNOSIS — Z6830 Body mass index (BMI) 30.0-30.9, adult: Secondary | ICD-10-CM | POA: Diagnosis not present

## 2023-04-29 DIAGNOSIS — K760 Fatty (change of) liver, not elsewhere classified: Secondary | ICD-10-CM | POA: Diagnosis not present

## 2023-04-29 DIAGNOSIS — E782 Mixed hyperlipidemia: Secondary | ICD-10-CM | POA: Diagnosis not present

## 2023-04-29 DIAGNOSIS — Z79899 Other long term (current) drug therapy: Secondary | ICD-10-CM | POA: Diagnosis not present

## 2023-04-29 DIAGNOSIS — I10 Essential (primary) hypertension: Secondary | ICD-10-CM | POA: Diagnosis not present

## 2023-05-16 ENCOUNTER — Ambulatory Visit (INDEPENDENT_AMBULATORY_CARE_PROVIDER_SITE_OTHER): Payer: BC Managed Care – PPO | Admitting: Family Medicine

## 2023-05-16 ENCOUNTER — Encounter (INDEPENDENT_AMBULATORY_CARE_PROVIDER_SITE_OTHER): Payer: Self-pay | Admitting: Family Medicine

## 2023-05-16 VITALS — BP 121/77 | HR 78 | Temp 97.5°F | Ht 62.0 in | Wt 167.0 lb

## 2023-05-16 DIAGNOSIS — E1165 Type 2 diabetes mellitus with hyperglycemia: Secondary | ICD-10-CM | POA: Diagnosis not present

## 2023-05-16 DIAGNOSIS — Z6834 Body mass index (BMI) 34.0-34.9, adult: Secondary | ICD-10-CM

## 2023-05-16 DIAGNOSIS — E785 Hyperlipidemia, unspecified: Secondary | ICD-10-CM

## 2023-05-16 DIAGNOSIS — Z7985 Long-term (current) use of injectable non-insulin antidiabetic drugs: Secondary | ICD-10-CM

## 2023-05-16 DIAGNOSIS — E1169 Type 2 diabetes mellitus with other specified complication: Secondary | ICD-10-CM

## 2023-05-16 DIAGNOSIS — E669 Obesity, unspecified: Secondary | ICD-10-CM

## 2023-05-16 DIAGNOSIS — N83201 Unspecified ovarian cyst, right side: Secondary | ICD-10-CM | POA: Diagnosis not present

## 2023-05-16 DIAGNOSIS — Z6830 Body mass index (BMI) 30.0-30.9, adult: Secondary | ICD-10-CM

## 2023-05-16 MED ORDER — MOUNJARO 5 MG/0.5ML ~~LOC~~ SOAJ: 5.0000 mg | SUBCUTANEOUS | 0 refills | Status: DC

## 2023-05-16 NOTE — Progress Notes (Signed)
Chief Complaint:   OBESITY Sheila Guerra is here to discuss her progress with her obesity treatment plan along with follow-up of her obesity related diagnoses. Sheila Guerra is on the Category 3 Plan and the Pescatarian Plan + 300 calories and states she is following her eating plan approximately 75% of the time. Sheila Guerra states she is walking for 30 minutes 2-3 times per week.  Today's visit was #: 27 Starting weight: 190 lbs Starting date: 03/29/2021 Today's weight: 167 lbs Today's date: 05/16/2023 Total lbs lost to date: 23 Total lbs lost since last in-office visit: 0  Interim History: Over the last few weeks patient has been trying to exercise at least 4 days a week.  She has been walking in the am 4-5 days a week.  She is having surgery October 30th for replacement of breast implants.  Her mom is with her and is supportive.   Subjective:   1. Type 2 diabetes mellitus with hyperglycemia, without long-term current use of insulin ([name]) Patient's last A1c was 4.8 and insulin 11.8 on her labs at the end of August.  She is doing exceptionally well on Mounjaro.  I discussed labs with the patient today.  2. Hyperlipidemia associated with type 2 diabetes mellitus ([name]) Patient's last LDL was 64, triglycerides 94, and HDL 57.  She is on Crestor daily.  I discussed labs with the patient today.  Assessment/Plan:   1. Type 2 diabetes mellitus with hyperglycemia, without long-term current use of insulin ([name]) We will refill Mounjaro 5 mg subcu weekly for 90 days.  - tirzepatide [facility]) 5 MG/0.5ML Pen; Inject 5 mg into the skin once a week.  Dispense: 6 mL; Refill: 0  2. Hyperlipidemia associated with type 2 diabetes mellitus ([name]) We will repeat labs in January; last labs at goal.  3. BMI 30.0-30.9,adult  4. Obesity, with starting BMI 34.75 Sheila Guerra is currently in the action stage of change. As such, her goal is to continue with weight loss efforts. She has agreed to the Category 3 Plan and the  Pescatarian Plan.   Exercise goals: All adults should avoid inactivity. Some physical activity is better than none, and adults who participate in any amount of physical activity gain some health benefits.  Patient will be limited after this month due to surgery recovery.  Behavioral modification strategies: increasing lean protein intake, meal planning and cooking strategies, keeping healthy foods in the home, and planning for success.  Sheila Guerra has agreed to follow-up with our clinic in 12 weeks. She was informed of the importance of frequent follow-up visits to maximize her success with intensive lifestyle modifications for her multiple health conditions.   Objective:   Blood pressure 121/77, pulse 78, temperature (!) 97.5 F (36.4 C), height 5\' 2"  (1.575 m), weight 167 lb (75.8 kg), last menstrual period 09/14/2017, SpO2 99%. Body mass index is 30.54 kg/m.  General: Cooperative, alert, well developed, in no acute distress. HEENT: Conjunctivae and lids unremarkable. Cardiovascular: Regular rhythm.  Lungs: Normal work of breathing. Neurologic: No focal deficits.   Lab Results  Component Value Date   CREATININE 0.65 04/10/2022   BUN 13 04/10/2022   NA 141 04/10/2022   K 4.6 04/10/2022   CL 105 04/10/2022   CO2 22 04/10/2022   Lab Results  Component Value Date   ALT 22 04/10/2022   AST 21 04/10/2022   ALKPHOS 69 04/10/2022   BILITOT 0.3 04/10/2022   Lab Results  Component Value Date   HGBA1C 4.8 04/10/2022  HGBA1C 5.2 09/12/2021   HGBA1C 6.8 (H) 03/29/2021   Lab Results  Component Value Date   INSULIN 11.8 04/10/2022   INSULIN 11.9 09/12/2021   INSULIN 53.3 (H) 03/29/2021   Lab Results  Component Value Date   TSH 2.220 03/29/2021   Lab Results  Component Value Date   CHOL 139 04/10/2022   HDL 57 04/10/2022   LDLCALC 64 04/10/2022   TRIG 94 04/10/2022   Lab Results  Component Value Date   VD25OH 76.4 04/10/2022   VD25OH 58.2 09/12/2021   VD25OH 38.4  03/29/2021   Lab Results  Component Value Date   WBC 8.0 03/29/2021   HGB 12.6 03/29/2021   HCT 38.0 03/29/2021   MCV 90 03/29/2021   PLT 272 03/29/2021   No results found for: "IRON", "TIBC", "FERRITIN"  Attestation Statements:   Reviewed by clinician on day of visit: allergies, medications, problem list, medical history, surgical history, family history, social history, and previous encounter notes.   I, Burt Knack, am acting as transcriptionist for Reuben Likes, MD.  I have reviewed the above documentation for accuracy and completeness, and I agree with the above. - Reuben Likes, MD

## 2023-05-21 DIAGNOSIS — G4733 Obstructive sleep apnea (adult) (pediatric): Secondary | ICD-10-CM | POA: Diagnosis not present

## 2023-05-23 DIAGNOSIS — Z01818 Encounter for other preprocedural examination: Secondary | ICD-10-CM | POA: Diagnosis not present

## 2023-05-23 DIAGNOSIS — Z9882 Breast implant status: Secondary | ICD-10-CM | POA: Diagnosis not present

## 2023-05-23 DIAGNOSIS — G4733 Obstructive sleep apnea (adult) (pediatric): Secondary | ICD-10-CM | POA: Diagnosis not present

## 2023-06-13 DIAGNOSIS — Z7982 Long term (current) use of aspirin: Secondary | ICD-10-CM | POA: Diagnosis not present

## 2023-06-13 DIAGNOSIS — T8543XA Leakage of breast prosthesis and implant, initial encounter: Secondary | ICD-10-CM | POA: Diagnosis not present

## 2023-06-13 DIAGNOSIS — Z7984 Long term (current) use of oral hypoglycemic drugs: Secondary | ICD-10-CM | POA: Diagnosis not present

## 2023-06-13 DIAGNOSIS — E119 Type 2 diabetes mellitus without complications: Secondary | ICD-10-CM | POA: Diagnosis not present

## 2023-06-13 DIAGNOSIS — Y838 Other surgical procedures as the cause of abnormal reaction of the patient, or of later complication, without mention of misadventure at the time of the procedure: Secondary | ICD-10-CM | POA: Diagnosis not present

## 2023-06-13 DIAGNOSIS — Z79899 Other long term (current) drug therapy: Secondary | ICD-10-CM | POA: Diagnosis not present

## 2023-06-13 DIAGNOSIS — T85898A Other specified complication of other internal prosthetic devices, implants and grafts, initial encounter: Secondary | ICD-10-CM | POA: Diagnosis not present

## 2023-06-13 DIAGNOSIS — Z9882 Breast implant status: Secondary | ICD-10-CM | POA: Diagnosis not present

## 2023-06-13 DIAGNOSIS — I10 Essential (primary) hypertension: Secondary | ICD-10-CM | POA: Diagnosis not present

## 2023-06-13 DIAGNOSIS — G4733 Obstructive sleep apnea (adult) (pediatric): Secondary | ICD-10-CM | POA: Diagnosis not present

## 2023-06-13 DIAGNOSIS — Z6830 Body mass index (BMI) 30.0-30.9, adult: Secondary | ICD-10-CM | POA: Diagnosis not present

## 2023-06-13 DIAGNOSIS — Z7985 Long-term (current) use of injectable non-insulin antidiabetic drugs: Secondary | ICD-10-CM | POA: Diagnosis not present

## 2023-06-13 DIAGNOSIS — Z421 Encounter for breast reconstruction following mastectomy: Secondary | ICD-10-CM | POA: Diagnosis not present

## 2023-06-20 DIAGNOSIS — Z09 Encounter for follow-up examination after completed treatment for conditions other than malignant neoplasm: Secondary | ICD-10-CM | POA: Diagnosis not present

## 2023-06-23 DIAGNOSIS — G4733 Obstructive sleep apnea (adult) (pediatric): Secondary | ICD-10-CM | POA: Diagnosis not present

## 2023-07-04 DIAGNOSIS — Z09 Encounter for follow-up examination after completed treatment for conditions other than malignant neoplasm: Secondary | ICD-10-CM | POA: Diagnosis not present

## 2023-07-18 DIAGNOSIS — Z6831 Body mass index (BMI) 31.0-31.9, adult: Secondary | ICD-10-CM | POA: Diagnosis not present

## 2023-07-18 DIAGNOSIS — H6690 Otitis media, unspecified, unspecified ear: Secondary | ICD-10-CM | POA: Diagnosis not present

## 2023-07-23 DIAGNOSIS — G4733 Obstructive sleep apnea (adult) (pediatric): Secondary | ICD-10-CM | POA: Diagnosis not present

## 2023-07-31 DIAGNOSIS — Z09 Encounter for follow-up examination after completed treatment for conditions other than malignant neoplasm: Secondary | ICD-10-CM | POA: Diagnosis not present

## 2023-08-09 ENCOUNTER — Other Ambulatory Visit (INDEPENDENT_AMBULATORY_CARE_PROVIDER_SITE_OTHER): Payer: Self-pay | Admitting: Family Medicine

## 2023-08-09 DIAGNOSIS — E1165 Type 2 diabetes mellitus with hyperglycemia: Secondary | ICD-10-CM

## 2023-08-22 ENCOUNTER — Encounter (INDEPENDENT_AMBULATORY_CARE_PROVIDER_SITE_OTHER): Payer: Self-pay | Admitting: Family Medicine

## 2023-08-22 ENCOUNTER — Ambulatory Visit (INDEPENDENT_AMBULATORY_CARE_PROVIDER_SITE_OTHER): Payer: BC Managed Care – PPO | Admitting: Family Medicine

## 2023-08-22 VITALS — BP 118/82 | HR 110 | Temp 97.7°F | Ht 62.0 in | Wt 170.0 lb

## 2023-08-22 DIAGNOSIS — Z6831 Body mass index (BMI) 31.0-31.9, adult: Secondary | ICD-10-CM | POA: Diagnosis not present

## 2023-08-22 DIAGNOSIS — N838 Other noninflammatory disorders of ovary, fallopian tube and broad ligament: Secondary | ICD-10-CM

## 2023-08-22 DIAGNOSIS — E669 Obesity, unspecified: Secondary | ICD-10-CM

## 2023-08-22 DIAGNOSIS — E66811 Obesity, class 1: Secondary | ICD-10-CM

## 2023-08-22 DIAGNOSIS — Z6834 Body mass index (BMI) 34.0-34.9, adult: Secondary | ICD-10-CM

## 2023-08-22 DIAGNOSIS — E1165 Type 2 diabetes mellitus with hyperglycemia: Secondary | ICD-10-CM

## 2023-08-22 DIAGNOSIS — Z7985 Long-term (current) use of injectable non-insulin antidiabetic drugs: Secondary | ICD-10-CM

## 2023-08-22 MED ORDER — MOUNJARO 5 MG/0.5ML ~~LOC~~ SOAJ
5.0000 mg | SUBCUTANEOUS | 0 refills | Status: DC
Start: 2023-08-22 — End: 2023-11-05

## 2023-08-22 NOTE — Assessment & Plan Note (Signed)
 Patient interested in decreasing or spacing out her Mounjaro.  She has upcoming labs with Dr. Katrinka Blazing next month.  She needs a refill of Mounjaro today.  Will refill at same dose- patient to space out dosing to every 10 days.

## 2023-08-22 NOTE — Progress Notes (Signed)
   SUBJECTIVE:  Chief Complaint: Obesity  Interim History: Patient had a good holiday season.  She is following up from last appointment in October.  Her surgery went well and she believes another surgery is coming.  She has been very mindful of food choices particularly since she knew her activity would be limited after her implant exchange surgery. No upcoming other plans except that. Still feels the Category 3 is doable.  She feels it works fine with her current routine.   Sheila Guerra is here to discuss her progress with her obesity treatment plan. She is on the Category 3 Plan and states she is following her eating plan approximately 75 % of the time. She states she is not exercising.   OBJECTIVE: Visit Diagnoses: Problem List Items Addressed This Visit       Endocrine   Type 2 diabetes mellitus with hyperglycemia, without long-term current use of insulin  ([name]) - Primary   Patient interested in decreasing or spacing out her Mounjaro .  She has upcoming labs with Dr. Claudene next month.  She needs a refill of Mounjaro  today.  Will refill at same dose- patient to space out dosing to every 10 days.      Relevant Medications   tirzepatide  (MOUNJARO ) 5 MG/0.5ML Pen     Other   Ovarian mass, right   Recent ultrasound showing right sided ovarian mass that was slightly increased in size compared to prior ultrasound years ago.  She mentions she has follow up imaging and office appointments to further evaluate what etiology of mass is.  She will go to those appointments and send a Mychart if further surgical intervention is necessary for guidance and refills of her medication.      Other Visit Diagnoses       Obesity, with starting BMI 34.75       Relevant Medications   tirzepatide  (MOUNJARO ) 5 MG/0.5ML Pen     BMI 31.0-31.9,adult           No data recorded No data recorded No data recorded No data recorded   ASSESSMENT AND PLAN:  Diet: Rosaleah is currently in the action stage of  change. As such, her goal is to continue with weight loss efforts. She has agreed to Category 3 Plan.  Exercise: Chayse has been instructed that some exercise is better than none for weight loss and overall health benefits.   Behavior Modification:  We discussed the following Behavioral Modification Strategies today: increasing lean protein intake, increasing vegetables, meal planning and cooking strategies, better snacking choices, and planning for success.   No follow-ups on file.SABRA She was informed of the importance of frequent follow up visits to maximize her success with intensive lifestyle modifications for her multiple health conditions.  Attestation Statements:   Reviewed by clinician on day of visit: allergies, medications, problem list, medical history, surgical history, family history, social history, and previous encounter notes.     Adelita Cho, MD

## 2023-08-23 DIAGNOSIS — G4733 Obstructive sleep apnea (adult) (pediatric): Secondary | ICD-10-CM | POA: Diagnosis not present

## 2023-08-29 DIAGNOSIS — N838 Other noninflammatory disorders of ovary, fallopian tube and broad ligament: Secondary | ICD-10-CM | POA: Insufficient documentation

## 2023-08-29 DIAGNOSIS — N83201 Unspecified ovarian cyst, right side: Secondary | ICD-10-CM | POA: Diagnosis not present

## 2023-08-29 NOTE — Assessment & Plan Note (Signed)
 Recent ultrasound showing right sided ovarian mass that was slightly increased in size compared to prior ultrasound years ago.  She mentions she has follow up imaging and office appointments to further evaluate what etiology of mass is.  She will go to those appointments and send a Mychart if further surgical intervention is necessary for guidance and refills of her medication.

## 2023-09-12 DIAGNOSIS — K760 Fatty (change of) liver, not elsewhere classified: Secondary | ICD-10-CM | POA: Diagnosis not present

## 2023-09-12 DIAGNOSIS — Z1211 Encounter for screening for malignant neoplasm of colon: Secondary | ICD-10-CM | POA: Diagnosis not present

## 2023-09-15 DIAGNOSIS — E1140 Type 2 diabetes mellitus with diabetic neuropathy, unspecified: Secondary | ICD-10-CM | POA: Diagnosis not present

## 2023-09-15 DIAGNOSIS — K760 Fatty (change of) liver, not elsewhere classified: Secondary | ICD-10-CM | POA: Diagnosis not present

## 2023-09-15 DIAGNOSIS — Z79899 Other long term (current) drug therapy: Secondary | ICD-10-CM | POA: Diagnosis not present

## 2023-09-15 DIAGNOSIS — Z6831 Body mass index (BMI) 31.0-31.9, adult: Secondary | ICD-10-CM | POA: Diagnosis not present

## 2023-09-15 DIAGNOSIS — I10 Essential (primary) hypertension: Secondary | ICD-10-CM | POA: Diagnosis not present

## 2023-09-15 DIAGNOSIS — E782 Mixed hyperlipidemia: Secondary | ICD-10-CM | POA: Diagnosis not present

## 2023-09-15 DIAGNOSIS — E559 Vitamin D deficiency, unspecified: Secondary | ICD-10-CM | POA: Diagnosis not present

## 2023-09-18 DIAGNOSIS — N838 Other noninflammatory disorders of ovary, fallopian tube and broad ligament: Secondary | ICD-10-CM | POA: Diagnosis not present

## 2023-09-23 DIAGNOSIS — G4733 Obstructive sleep apnea (adult) (pediatric): Secondary | ICD-10-CM | POA: Diagnosis not present

## 2023-09-25 DIAGNOSIS — Z90710 Acquired absence of both cervix and uterus: Secondary | ICD-10-CM | POA: Diagnosis not present

## 2023-09-25 DIAGNOSIS — Z7982 Long term (current) use of aspirin: Secondary | ICD-10-CM | POA: Diagnosis not present

## 2023-09-25 DIAGNOSIS — Z9079 Acquired absence of other genital organ(s): Secondary | ICD-10-CM | POA: Diagnosis not present

## 2023-09-25 DIAGNOSIS — G4733 Obstructive sleep apnea (adult) (pediatric): Secondary | ICD-10-CM | POA: Diagnosis not present

## 2023-09-25 DIAGNOSIS — N838 Other noninflammatory disorders of ovary, fallopian tube and broad ligament: Secondary | ICD-10-CM | POA: Diagnosis not present

## 2023-09-25 DIAGNOSIS — Z803 Family history of malignant neoplasm of breast: Secondary | ICD-10-CM | POA: Diagnosis not present

## 2023-09-25 DIAGNOSIS — Z8759 Personal history of other complications of pregnancy, childbirth and the puerperium: Secondary | ICD-10-CM | POA: Diagnosis not present

## 2023-09-25 DIAGNOSIS — I10 Essential (primary) hypertension: Secondary | ICD-10-CM | POA: Diagnosis not present

## 2023-09-25 DIAGNOSIS — Z808 Family history of malignant neoplasm of other organs or systems: Secondary | ICD-10-CM | POA: Diagnosis not present

## 2023-09-25 DIAGNOSIS — Z91013 Allergy to seafood: Secondary | ICD-10-CM | POA: Diagnosis not present

## 2023-09-25 DIAGNOSIS — D3911 Neoplasm of uncertain behavior of right ovary: Secondary | ICD-10-CM | POA: Diagnosis not present

## 2023-09-25 DIAGNOSIS — Z79899 Other long term (current) drug therapy: Secondary | ICD-10-CM | POA: Diagnosis not present

## 2023-09-25 DIAGNOSIS — Z98891 History of uterine scar from previous surgery: Secondary | ICD-10-CM | POA: Diagnosis not present

## 2023-10-02 ENCOUNTER — Encounter (INDEPENDENT_AMBULATORY_CARE_PROVIDER_SITE_OTHER): Payer: Self-pay | Admitting: Family Medicine

## 2023-10-05 DIAGNOSIS — D3911 Neoplasm of uncertain behavior of right ovary: Secondary | ICD-10-CM | POA: Diagnosis not present

## 2023-10-05 DIAGNOSIS — Z9889 Other specified postprocedural states: Secondary | ICD-10-CM | POA: Diagnosis not present

## 2023-10-17 ENCOUNTER — Ambulatory Visit (INDEPENDENT_AMBULATORY_CARE_PROVIDER_SITE_OTHER): Payer: BC Managed Care – PPO | Admitting: Family Medicine

## 2023-10-24 DIAGNOSIS — Z09 Encounter for follow-up examination after completed treatment for conditions other than malignant neoplasm: Secondary | ICD-10-CM | POA: Diagnosis not present

## 2023-10-24 DIAGNOSIS — Z9882 Breast implant status: Secondary | ICD-10-CM | POA: Diagnosis not present

## 2023-10-28 DIAGNOSIS — G4733 Obstructive sleep apnea (adult) (pediatric): Secondary | ICD-10-CM | POA: Diagnosis not present

## 2023-11-05 ENCOUNTER — Encounter (INDEPENDENT_AMBULATORY_CARE_PROVIDER_SITE_OTHER): Payer: Self-pay | Admitting: Family Medicine

## 2023-11-05 ENCOUNTER — Ambulatory Visit (INDEPENDENT_AMBULATORY_CARE_PROVIDER_SITE_OTHER): Payer: BC Managed Care – PPO | Admitting: Family Medicine

## 2023-11-05 ENCOUNTER — Encounter (INDEPENDENT_AMBULATORY_CARE_PROVIDER_SITE_OTHER): Payer: Self-pay

## 2023-11-05 VITALS — BP 118/76 | HR 78 | Temp 97.9°F | Ht 62.0 in | Wt 168.0 lb

## 2023-11-05 DIAGNOSIS — E1165 Type 2 diabetes mellitus with hyperglycemia: Secondary | ICD-10-CM | POA: Diagnosis not present

## 2023-11-05 DIAGNOSIS — I152 Hypertension secondary to endocrine disorders: Secondary | ICD-10-CM

## 2023-11-05 DIAGNOSIS — E1159 Type 2 diabetes mellitus with other circulatory complications: Secondary | ICD-10-CM

## 2023-11-05 DIAGNOSIS — Z7985 Long-term (current) use of injectable non-insulin antidiabetic drugs: Secondary | ICD-10-CM

## 2023-11-05 DIAGNOSIS — Z6830 Body mass index (BMI) 30.0-30.9, adult: Secondary | ICD-10-CM

## 2023-11-05 DIAGNOSIS — Z6834 Body mass index (BMI) 34.0-34.9, adult: Secondary | ICD-10-CM

## 2023-11-05 DIAGNOSIS — E66811 Obesity, class 1: Secondary | ICD-10-CM | POA: Diagnosis not present

## 2023-11-05 MED ORDER — MOUNJARO 5 MG/0.5ML ~~LOC~~ SOAJ: 5.0000 mg | SUBCUTANEOUS | 0 refills | Status: DC

## 2023-11-05 NOTE — Assessment & Plan Note (Signed)
 Blood pressure well controlled today.  No chest pain, chest pressure or headache.  Will continue zestoretic at current dose.

## 2023-11-05 NOTE — Progress Notes (Signed)
 SUBJECTIVE:  Chief Complaint: Obesity  Interim History: Since last appointment she has had surgery and she is healing well.  She is not able to lift the weight she was previously.  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  She is dealing with significant hot flashes currently as well.   Food intake wise she has been focusing on protein intake.  She has followed the plan around 80%.  She is hoping to be able to slowly add exercise in more consistently as the weather improves.   Christabella is here to discuss her progress with her obesity treatment plan. She is on the Category 3 Plan and states she is following her eating plan approximately 80 % of the time. She states she is not exercising.   OBJECTIVE: Visit Diagnoses: Problem List Items Addressed This Visit       Cardiovascular and Mediastinum   Hypertension associated with diabetes ([name])   Blood pressure well controlled today.  No chest pain, chest pressure or headache.  Will continue zestoretic at current dose.      Relevant Medications   tirzepatide [facility]) 5 MG/0.5ML Pen     Endocrine   Type 2 diabetes mellitus with hyperglycemia, without long-term current use of insulin ([name])   Patients doing well on Mounjaro 5mg  with improved glucose control.  She was off medication for recent surgery but is back on it now and has had no GI issues with restarting.  Need refill of Mounjaro today.        Relevant Medications   tirzepatide (MOUNJARO) 5 MG/0.5ML Pen     Other   Class 1 obesity with serious comorbidity and body mass index (BMI) of 34.0 to 34.9 in adult - Primary   Anthropometric Measurements Height: 5\' 2"  (1.575 m) Weight: 168 lb (76.2 kg) BMI (Calculated): 30.72 Weight at Last Visit: 170 lb Weight Lost Since Last Visit: 2 Weight Gained Since Last Visit: 0 Starting Weight: 190 lb Total Weight Loss (lbs): 22 lb (9.979 kg) Body Composition  Body Fat %: 37.6 % Fat Mass (lbs): 63.2 lbs Muscle Mass (lbs):  99.4 lbs Total Body Water (lbs): 73.6 lbs Visceral Fat Rating : 8 Other Clinical Data Fasting: no Labs: no Today's Visit #: 29 Starting Date: 03/29/21 Comments: Cat 3       Relevant Medications   tirzepatide [facility]) 5 MG/0.5ML Pen   Other Visit Diagnoses       BMI 30.0-30.9,adult           No data recorded       11/05/2023    9:00 AM 08/22/2023    9:00 AM 05/16/2023    9:00 AM  Vitals with BMI  Height 5\' 2"  5\' 2"  5\' 2"   Weight 168 lbs 170 lbs 167 lbs  BMI 30.72 31.09 30.54  Systolic 118 118 161  Diastolic 76 82 77  Pulse 78 110 78      ASSESSMENT AND PLAN:  Diet: Sabryn is currently in the action stage of change. As such, her goal is to continue with weight loss efforts and has agreed to the Category 3 Plan.   Exercise:  All adults should avoid inactivity. Some activity is better than none, and adults who participate in any amount of physical activity, gain some health benefits.  Behavior Modification:  We discussed the following Behavioral Modification Strategies today: increasing lean protein intake, increasing vegetables, meal planning and cooking strategies, avoiding temptations, and planning for success. Return in about 8  weeks (around 12/31/2023).Marland Kitchen She was informed of the importance of frequent follow up visits to maximize her success with intensive lifestyle modifications for her multiple health conditions.  Attestation Statements:   Reviewed by clinician on day of visit: allergies, medications, problem list, medical history, surgical history, family history, social history, and previous encounter notes.     Reuben Likes, MD

## 2023-11-12 DIAGNOSIS — E782 Mixed hyperlipidemia: Secondary | ICD-10-CM | POA: Diagnosis not present

## 2023-11-12 DIAGNOSIS — R059 Cough, unspecified: Secondary | ICD-10-CM | POA: Diagnosis not present

## 2023-11-12 DIAGNOSIS — Z6831 Body mass index (BMI) 31.0-31.9, adult: Secondary | ICD-10-CM | POA: Diagnosis not present

## 2023-11-12 DIAGNOSIS — I10 Essential (primary) hypertension: Secondary | ICD-10-CM | POA: Diagnosis not present

## 2023-11-12 DIAGNOSIS — E1140 Type 2 diabetes mellitus with diabetic neuropathy, unspecified: Secondary | ICD-10-CM | POA: Diagnosis not present

## 2023-11-12 DIAGNOSIS — J018 Other acute sinusitis: Secondary | ICD-10-CM | POA: Diagnosis not present

## 2023-11-12 DIAGNOSIS — Z6834 Body mass index (BMI) 34.0-34.9, adult: Secondary | ICD-10-CM | POA: Insufficient documentation

## 2023-11-12 NOTE — Assessment & Plan Note (Signed)
 Anthropometric Measurements Height: 5\' 2"  (1.575 m) Weight: 168 lb (76.2 kg) BMI (Calculated): 30.72 Weight at Last Visit: 170 lb Weight Lost Since Last Visit: 2 Weight Gained Since Last Visit: 0 Starting Weight: 190 lb Total Weight Loss (lbs): 22 lb (9.979 kg) Body Composition  Body Fat %: 37.6 % Fat Mass (lbs): 63.2 lbs Muscle Mass (lbs): 99.4 lbs Total Body Water (lbs): 73.6 lbs Visceral Fat Rating : 8 Other Clinical Data Fasting: no Labs: no Today's Visit #: 29 Starting Date: 03/29/21 Comments: Cat 3

## 2023-11-12 NOTE — Assessment & Plan Note (Signed)
 Patients doing well on Mounjaro 5mg  with improved glucose control.  She was off medication for recent surgery but is back on it now and has had no GI issues with restarting.  Need refill of Mounjaro today.

## 2023-11-17 DIAGNOSIS — K648 Other hemorrhoids: Secondary | ICD-10-CM | POA: Diagnosis not present

## 2023-11-17 DIAGNOSIS — E119 Type 2 diabetes mellitus without complications: Secondary | ICD-10-CM | POA: Diagnosis not present

## 2023-11-17 DIAGNOSIS — Z1211 Encounter for screening for malignant neoplasm of colon: Secondary | ICD-10-CM | POA: Diagnosis not present

## 2023-12-02 DIAGNOSIS — K635 Polyp of colon: Secondary | ICD-10-CM | POA: Diagnosis not present

## 2023-12-11 DIAGNOSIS — E8940 Asymptomatic postprocedural ovarian failure: Secondary | ICD-10-CM | POA: Diagnosis not present

## 2023-12-19 DIAGNOSIS — K760 Fatty (change of) liver, not elsewhere classified: Secondary | ICD-10-CM | POA: Diagnosis not present

## 2023-12-28 DIAGNOSIS — G4733 Obstructive sleep apnea (adult) (pediatric): Secondary | ICD-10-CM | POA: Diagnosis not present

## 2023-12-30 DIAGNOSIS — R0602 Shortness of breath: Secondary | ICD-10-CM | POA: Diagnosis not present

## 2023-12-30 DIAGNOSIS — Z139 Encounter for screening, unspecified: Secondary | ICD-10-CM | POA: Diagnosis not present

## 2023-12-30 DIAGNOSIS — Z6832 Body mass index (BMI) 32.0-32.9, adult: Secondary | ICD-10-CM | POA: Diagnosis not present

## 2023-12-30 DIAGNOSIS — I10 Essential (primary) hypertension: Secondary | ICD-10-CM | POA: Diagnosis not present

## 2023-12-30 DIAGNOSIS — E559 Vitamin D deficiency, unspecified: Secondary | ICD-10-CM | POA: Diagnosis not present

## 2023-12-30 DIAGNOSIS — E782 Mixed hyperlipidemia: Secondary | ICD-10-CM | POA: Diagnosis not present

## 2023-12-30 DIAGNOSIS — Z0000 Encounter for general adult medical examination without abnormal findings: Secondary | ICD-10-CM | POA: Diagnosis not present

## 2023-12-30 DIAGNOSIS — K760 Fatty (change of) liver, not elsewhere classified: Secondary | ICD-10-CM | POA: Diagnosis not present

## 2023-12-30 DIAGNOSIS — Z114 Encounter for screening for human immunodeficiency virus [HIV]: Secondary | ICD-10-CM | POA: Diagnosis not present

## 2023-12-30 DIAGNOSIS — Z79899 Other long term (current) drug therapy: Secondary | ICD-10-CM | POA: Diagnosis not present

## 2023-12-30 DIAGNOSIS — E1140 Type 2 diabetes mellitus with diabetic neuropathy, unspecified: Secondary | ICD-10-CM | POA: Diagnosis not present

## 2024-01-02 ENCOUNTER — Encounter (INDEPENDENT_AMBULATORY_CARE_PROVIDER_SITE_OTHER): Payer: Self-pay | Admitting: Family Medicine

## 2024-01-02 ENCOUNTER — Ambulatory Visit (INDEPENDENT_AMBULATORY_CARE_PROVIDER_SITE_OTHER): Admitting: Family Medicine

## 2024-01-02 VITALS — BP 125/80 | HR 81 | Temp 97.7°F | Ht 62.0 in | Wt 173.0 lb

## 2024-01-02 DIAGNOSIS — E66811 Obesity, class 1: Secondary | ICD-10-CM | POA: Diagnosis not present

## 2024-01-02 DIAGNOSIS — I152 Hypertension secondary to endocrine disorders: Secondary | ICD-10-CM

## 2024-01-02 DIAGNOSIS — Z7985 Long-term (current) use of injectable non-insulin antidiabetic drugs: Secondary | ICD-10-CM

## 2024-01-02 DIAGNOSIS — E1165 Type 2 diabetes mellitus with hyperglycemia: Secondary | ICD-10-CM

## 2024-01-02 DIAGNOSIS — Z6831 Body mass index (BMI) 31.0-31.9, adult: Secondary | ICD-10-CM

## 2024-01-02 DIAGNOSIS — E1159 Type 2 diabetes mellitus with other circulatory complications: Secondary | ICD-10-CM

## 2024-01-02 NOTE — Progress Notes (Signed)
 SUBJECTIVE:  Chief Complaint: Obesity  Interim History: Patient has been really struggling with seasonal allergies this season. She has just started back walking due to this.  She realizes she needs to get back to starting strength training again.  She is excited for kids to be out of school.  She is eating 75% on the plan but 25% off the plan.  Planning to go the mountains for [employer].  Over the next 4 weeks she wants to really refocus on protein.  Destenie is here to discuss her progress with her obesity treatment plan. She is on the Category 3 Plan and states she is following her eating plan approximately 75 % of the time. She states she is walking 30 minutes 4-5 times per week.   OBJECTIVE: Visit Diagnoses: Problem List Items Addressed This Visit       Cardiovascular and Mediastinum   Hypertension associated with diabetes ([name]) - Primary   Blood pressure well controlled today.  Patient is on zestoretic daily.  No change in medication as BP controlled and no symptoms of chest pain, chest pressure or headache.        Endocrine   Type 2 diabetes mellitus with hyperglycemia, without long-term current use of insulin  ([name])   On Mounjaro  5mg  that she is taking about every 5 days.  Recent labs with PCP but not available yet for patient to share. Will request labs to review prior to next appointment.        Other   Class 1 obesity with serious comorbidity and body mass index (BMI) of 34.0 to 34.9 in adult   Other Visit Diagnoses       BMI 31.0-31.9,adult           Vitals Temp: 97.7 F (36.5 C) BP: 125/80 Pulse Rate: 81 SpO2: 99 %   Anthropometric Measurements Height: 5\' 2"  (1.575 m) Weight: 173 lb (78.5 kg) BMI (Calculated): 31.63 Weight at Last Visit: 168 lb Weight Lost Since Last Visit: 0 Weight Gained Since Last Visit: 0 Starting Weight: 190 lb Total Weight Loss (lbs): 17 lb (7.711 kg)   Body Composition  Body Fat %: 37.4 % Fat Mass (lbs): 65 lbs Muscle  Mass (lbs): 103 lbs Total Body Water (lbs): 74.8 lbs Visceral Fat Rating : 8   Other Clinical Data Fasting: no Labs: no Today's Visit #: 30 Starting Date: 03/29/21 Comments: Cat 3     ASSESSMENT AND PLAN:  Diet: Jasara is currently in the action stage of change. As such, her goal is to continue with weight loss efforts and has agreed to the Category 3 Plan.   Exercise:  For substantial health benefits, adults should do at least 150 minutes (2 hours and 30 minutes) a week of moderate-intensity, or 75 minutes (1 hour and 15 minutes) a week of vigorous-intensity aerobic physical activity, or an equivalent combination of moderate- and vigorous-intensity aerobic activity. Aerobic activity should be performed in episodes of at least 10 minutes, and preferably, it should be spread throughout the week.  Behavior Modification:  We discussed the following Behavioral Modification Strategies today: increasing lean protein intake, decreasing simple carbohydrates, increasing vegetables, meal planning and cooking strategies, keeping healthy foods in the home, and planning for success.   Return in about 4 weeks (around 01/30/2024).   She was informed of the importance of frequent follow up visits to maximize her success with intensive lifestyle modifications for her multiple health conditions.  Attestation Statements:   Reviewed by clinician on day  of visit: allergies, medications, problem list, medical history, surgical history, family history, social history, and previous encounter notes.    Donaciano Frizzle, MD

## 2024-01-02 NOTE — Assessment & Plan Note (Signed)
 Blood pressure well controlled today.  Patient is on zestoretic daily.  No change in medication as BP controlled and no symptoms of chest pain, chest pressure or headache.

## 2024-01-02 NOTE — Assessment & Plan Note (Signed)
 On Mounjaro  5mg  that she is taking about every 5 days.  Recent labs with PCP but not available yet for patient to share. Will request labs to review prior to next appointment.

## 2024-01-18 DIAGNOSIS — E782 Mixed hyperlipidemia: Secondary | ICD-10-CM | POA: Diagnosis not present

## 2024-01-18 DIAGNOSIS — E1140 Type 2 diabetes mellitus with diabetic neuropathy, unspecified: Secondary | ICD-10-CM | POA: Diagnosis not present

## 2024-01-18 DIAGNOSIS — R0602 Shortness of breath: Secondary | ICD-10-CM | POA: Diagnosis not present

## 2024-01-18 DIAGNOSIS — I10 Essential (primary) hypertension: Secondary | ICD-10-CM | POA: Diagnosis not present

## 2024-01-18 DIAGNOSIS — Z6833 Body mass index (BMI) 33.0-33.9, adult: Secondary | ICD-10-CM | POA: Diagnosis not present

## 2024-01-27 DIAGNOSIS — G4733 Obstructive sleep apnea (adult) (pediatric): Secondary | ICD-10-CM | POA: Diagnosis not present

## 2024-01-28 DIAGNOSIS — G4733 Obstructive sleep apnea (adult) (pediatric): Secondary | ICD-10-CM | POA: Diagnosis not present

## 2024-02-11 ENCOUNTER — Encounter (INDEPENDENT_AMBULATORY_CARE_PROVIDER_SITE_OTHER): Payer: Self-pay | Admitting: Family Medicine

## 2024-02-11 ENCOUNTER — Ambulatory Visit (INDEPENDENT_AMBULATORY_CARE_PROVIDER_SITE_OTHER): Admitting: Family Medicine

## 2024-02-11 VITALS — BP 126/84 | HR 72 | Temp 97.7°F | Ht 62.0 in | Wt 176.0 lb

## 2024-02-11 DIAGNOSIS — E66811 Obesity, class 1: Secondary | ICD-10-CM

## 2024-02-11 DIAGNOSIS — E1165 Type 2 diabetes mellitus with hyperglycemia: Secondary | ICD-10-CM

## 2024-02-11 DIAGNOSIS — Z7985 Long-term (current) use of injectable non-insulin antidiabetic drugs: Secondary | ICD-10-CM

## 2024-02-11 DIAGNOSIS — Z6832 Body mass index (BMI) 32.0-32.9, adult: Secondary | ICD-10-CM | POA: Diagnosis not present

## 2024-02-11 MED ORDER — MOUNJARO 5 MG/0.5ML ~~LOC~~ SOAJ: 5.0000 mg | SUBCUTANEOUS | 0 refills | Status: AC

## 2024-02-11 NOTE — Assessment & Plan Note (Signed)
 On Mounjaro  5mg  and was taking every 10 days.  Will change to taking every week to hopefully get more scale change.  Refill of current dose of mounjaro  sent to pharmacy.  Will re- evaluate if dose change is necessary in the next appt

## 2024-02-11 NOTE — Progress Notes (Signed)
 SUBJECTIVE:  Chief Complaint: Obesity  Interim History: Patient has been finishing school for the kids and traveling over the last few weeks.  She has been working and exercising. She has been much more consistent with her food intake. She has been taking Mounjaro  every 10 days.  She is interested in going back to every week.  Planning to bring kids to [facility] for a night for July 4th.  She is feeing better with her physical activity.   Sheila Guerra is here to discuss her progress with her obesity treatment plan. She is on the Category 3 Plan and Pescatarian Plan and states she is following her eating plan approximately 85 % of the time. She states she is exercising 15-20 minutes 3-4 times per week.   OBJECTIVE: Visit Diagnoses: Problem List Items Addressed This Visit       Endocrine   Type 2 diabetes mellitus with hyperglycemia, without long-term current use of insulin  ([name]) - Primary   On Mounjaro  5mg  and was taking every 10 days.  Will change to taking every week to hopefully get more scale change.  Refill of current dose of mounjaro  sent to pharmacy.  Will re- evaluate if dose change is necessary in the next appt      Relevant Medications   tirzepatide  (MOUNJARO ) 5 MG/0.5ML Pen     Other   Class 1 obesity with serious comorbidity and body mass index (BMI) of 34.0 to 34.9 in adult   Relevant Medications   tirzepatide  (MOUNJARO ) 5 MG/0.5ML Pen   Other Visit Diagnoses       BMI 32.0-32.9,adult           Vitals Temp: 97.7 F (36.5 C) BP: 126/84 Pulse Rate: 72 SpO2: 100 %   Anthropometric Measurements Height: 5' 2 (1.575 m) Weight: 176 lb (79.8 kg) BMI (Calculated): 32.18 Weight at Last Visit: 173lb Weight Lost Since Last Visit: 0 Weight Gained Since Last Visit: 3lb Starting Weight: 190lb Total Weight Loss (lbs): 14 lb (6.35 kg)   Body Composition  Body Fat %: 38.5 % Fat Mass (lbs): 67.8 lbs Muscle Mass (lbs): 102.8 lbs Total Body Water (lbs): 75.4  lbs Visceral Fat Rating : 9   Other Clinical Data Fasting: no Labs: no Today's Visit #: 31 Starting Date: 03/29/21 Comments: Cat 3     ASSESSMENT AND PLAN:  Diet: Sheila Guerra is currently in the action stage of change. As such, her goal is to continue with weight loss efforts and has agreed to the Category 3 Plan and the Pescatarian Plan + 300 calories.   Exercise:  For substantial health benefits, adults should do at least 150 minutes (2 hours and 30 minutes) a week of moderate-intensity, or 75 minutes (1 hour and 15 minutes) a week of vigorous-intensity aerobic physical activity, or an equivalent combination of moderate- and vigorous-intensity aerobic activity. Aerobic activity should be performed in episodes of at least 10 minutes, and preferably, it should be spread throughout the week.  Behavior Modification:  We discussed the following Behavioral Modification Strategies today: increasing lean protein intake, decreasing simple carbohydrates, increasing vegetables, meal planning and cooking strategies, and planning for success.   Return in about 4 weeks (around 03/10/2024).   She was informed of the importance of frequent follow up visits to maximize her success with intensive lifestyle modifications for her multiple health conditions.  Attestation Statements:   Reviewed by clinician on day of visit: allergies, medications, problem list, medical history, surgical history, family history, social history, and  previous encounter notes.     Sheila Cho, MD

## 2024-02-26 DIAGNOSIS — G4733 Obstructive sleep apnea (adult) (pediatric): Secondary | ICD-10-CM | POA: Diagnosis not present

## 2024-03-12 ENCOUNTER — Ambulatory Visit (INDEPENDENT_AMBULATORY_CARE_PROVIDER_SITE_OTHER): Admitting: Family Medicine

## 2024-03-12 ENCOUNTER — Encounter (INDEPENDENT_AMBULATORY_CARE_PROVIDER_SITE_OTHER): Payer: Self-pay | Admitting: Family Medicine

## 2024-03-12 VITALS — BP 122/82 | HR 77 | Temp 97.8°F | Ht 62.0 in | Wt 173.0 lb

## 2024-03-12 DIAGNOSIS — E669 Obesity, unspecified: Secondary | ICD-10-CM | POA: Diagnosis not present

## 2024-03-12 DIAGNOSIS — E1165 Type 2 diabetes mellitus with hyperglycemia: Secondary | ICD-10-CM

## 2024-03-12 DIAGNOSIS — Z6834 Body mass index (BMI) 34.0-34.9, adult: Secondary | ICD-10-CM

## 2024-03-12 DIAGNOSIS — Z7985 Long-term (current) use of injectable non-insulin antidiabetic drugs: Secondary | ICD-10-CM

## 2024-03-12 DIAGNOSIS — Z6831 Body mass index (BMI) 31.0-31.9, adult: Secondary | ICD-10-CM

## 2024-03-12 NOTE — Progress Notes (Unsigned)
 SUBJECTIVE:  Chief Complaint: Obesity  Interim History: Patient has been doing well so far with sticking on plan.  She has been doing more of the category 3 plan than the pescatarian.  She is trying to add more protein overall.  Biggest issues tend to be in the afternoon and so she tries to implement things to do in order to keep her cravings under control.  Biggest issue is sugar. Over the next few weeks she doesn't have much planned and anticipates being able to stay consistent with the same meal plan.  Biggest obstacle is time.  Realizes she needs to put more effort into her exercising.   Sheila Guerra is here to discuss her progress with her obesity treatment plan. She is on the [employer] and states she is following her eating plan approximately 85 % of the time. She states she is exercising 10-15 minutes 3-4 times per week.   OBJECTIVE: Visit Diagnoses: Problem List Items Addressed This Visit       Endocrine   Type 2 diabetes mellitus with hyperglycemia, without long-term current use of insulin  ([name]) - Primary     Other   Class 1 obesity with serious comorbidity and body mass index (BMI) of 34.0 to 34.9 in adult   Other Visit Diagnoses       BMI 31.0-31.9,adult           Vitals Temp: 97.8 F (36.6 C) BP: 122/82 Pulse Rate: 77 SpO2: 99 %   Anthropometric Measurements Height: 5' 2 (1.575 m) Weight: 173 lb (78.5 kg) BMI (Calculated): 31.63 Weight at Last Visit: 176lb Weight Lost Since Last Visit: 3lb Weight Gained Since Last Visit: 0 Starting Weight: 190lb Total Weight Loss (lbs): 17 lb (7.711 kg)   Body Composition  Body Fat %: 37.6 % Fat Mass (lbs): 65.2 lbs Muscle Mass (lbs): 103 lbs Total Body Water (lbs): 73.4 lbs Visceral Fat Rating : 8   Other Clinical Data Fasting: yes Labs: no Today's Visit #: 32 Starting Date: 03/29/21 Comments: Cat 3     ASSESSMENT AND PLAN: Assessment & Plan Type 2 diabetes mellitus with hyperglycemia, without  long-term current use of insulin  ([name]) Doing well on Mounjaro  5 mg dose she received a 60-month supply at last office visit and did not need a refill today.  Will need repeat labs in next 3 months. Obesity, with starting BMI 34.75  BMI 31.0-31.9,adult    Diet: Shantina is currently in the action stage of change. As such, her goal is to continue with weight loss efforts and has agreed to the Category 3 Plan and the Pescatarian Plan + 300.   Exercise:  For substantial health benefits, adults should do at least 150 minutes (2 hours and 30 minutes) a week of moderate-intensity, or 75 minutes (1 hour and 15 minutes) a week of vigorous-intensity aerobic physical activity, or an equivalent combination of moderate- and vigorous-intensity aerobic activity. Aerobic activity should be performed in episodes of at least 10 minutes, and preferably, it should be spread throughout the week.  Behavior Modification:  We discussed the following Behavioral Modification Strategies today: increasing lean protein intake, decreasing simple carbohydrates, increasing vegetables, meal planning and cooking strategies, and planning for success.   Return in about 6 weeks (around 04/23/2024).   She was informed of the importance of frequent follow up visits to maximize her success with intensive lifestyle modifications for her multiple health conditions.  Attestation Statements:   Reviewed by clinician on day of visit: allergies, medications, problem  list, medical history, surgical history, family history, social history, and previous encounter notes.   Sheila Cho, MD

## 2024-03-13 ENCOUNTER — Ambulatory Visit (INDEPENDENT_AMBULATORY_CARE_PROVIDER_SITE_OTHER): Admitting: Family Medicine

## 2024-03-13 NOTE — Assessment & Plan Note (Signed)
 Doing well on Mounjaro  5 mg dose she received a 42-month supply at last office visit and did not need a refill today.  Will need repeat labs in next 3 months.

## 2024-03-28 DIAGNOSIS — G4733 Obstructive sleep apnea (adult) (pediatric): Secondary | ICD-10-CM | POA: Diagnosis not present

## 2024-04-20 DIAGNOSIS — Z79899 Other long term (current) drug therapy: Secondary | ICD-10-CM | POA: Diagnosis not present

## 2024-04-20 DIAGNOSIS — K760 Fatty (change of) liver, not elsewhere classified: Secondary | ICD-10-CM | POA: Diagnosis not present

## 2024-04-20 DIAGNOSIS — Z6832 Body mass index (BMI) 32.0-32.9, adult: Secondary | ICD-10-CM | POA: Diagnosis not present

## 2024-04-20 DIAGNOSIS — E782 Mixed hyperlipidemia: Secondary | ICD-10-CM | POA: Diagnosis not present

## 2024-04-20 DIAGNOSIS — E1140 Type 2 diabetes mellitus with diabetic neuropathy, unspecified: Secondary | ICD-10-CM | POA: Diagnosis not present

## 2024-04-20 DIAGNOSIS — E559 Vitamin D deficiency, unspecified: Secondary | ICD-10-CM | POA: Diagnosis not present

## 2024-04-20 DIAGNOSIS — I10 Essential (primary) hypertension: Secondary | ICD-10-CM | POA: Diagnosis not present

## 2024-04-22 ENCOUNTER — Ambulatory Visit (INDEPENDENT_AMBULATORY_CARE_PROVIDER_SITE_OTHER): Admitting: Family Medicine

## 2024-04-29 DIAGNOSIS — G4733 Obstructive sleep apnea (adult) (pediatric): Secondary | ICD-10-CM | POA: Diagnosis not present

## 2024-05-21 DIAGNOSIS — Z8742 Personal history of other diseases of the female genital tract: Secondary | ICD-10-CM | POA: Diagnosis not present

## 2024-05-21 DIAGNOSIS — Z90710 Acquired absence of both cervix and uterus: Secondary | ICD-10-CM | POA: Diagnosis not present

## 2024-05-21 DIAGNOSIS — Z01419 Encounter for gynecological examination (general) (routine) without abnormal findings: Secondary | ICD-10-CM | POA: Diagnosis not present

## 2024-05-21 DIAGNOSIS — R102 Pelvic and perineal pain unspecified side: Secondary | ICD-10-CM | POA: Diagnosis not present
